# Patient Record
Sex: Female | Born: 1965 | Race: Black or African American | Hispanic: No | Marital: Single | Smoking: Never smoker
Health system: Southern US, Community
[De-identification: ages and names within clinical notes are randomized; demographics above are authoritative.]

## PROBLEM LIST (undated history)

## (undated) DIAGNOSIS — J302 Other seasonal allergic rhinitis: Secondary | ICD-10-CM

## (undated) DIAGNOSIS — G4730 Sleep apnea, unspecified: Secondary | ICD-10-CM

## (undated) DIAGNOSIS — K219 Gastro-esophageal reflux disease without esophagitis: Secondary | ICD-10-CM

## (undated) DIAGNOSIS — R51 Headache: Secondary | ICD-10-CM

## (undated) DIAGNOSIS — M797 Fibromyalgia: Secondary | ICD-10-CM

## (undated) DIAGNOSIS — R569 Unspecified convulsions: Secondary | ICD-10-CM

## (undated) DIAGNOSIS — J069 Acute upper respiratory infection, unspecified: Secondary | ICD-10-CM

## (undated) DIAGNOSIS — T783XXA Angioneurotic edema, initial encounter: Secondary | ICD-10-CM

## (undated) DIAGNOSIS — G459 Transient cerebral ischemic attack, unspecified: Secondary | ICD-10-CM

## (undated) HISTORY — PX: ABDOMINAL HYSTERECTOMY: SHX81

## (undated) HISTORY — PX: OOPHORECTOMY: SHX86

## (undated) HISTORY — PX: APPENDECTOMY: SHX54

## (undated) HISTORY — DX: Acute upper respiratory infection, unspecified: J06.9

## (undated) HISTORY — DX: Angioneurotic edema, initial encounter: T78.3XXA

## (undated) HISTORY — PX: ANKLE ARTHROSCOPY: SHX545

---

## 2006-04-17 ENCOUNTER — Emergency Department (HOSPITAL_COMMUNITY): Admission: EM | Admit: 2006-04-17 | Discharge: 2006-04-17 | Payer: Self-pay | Admitting: Emergency Medicine

## 2006-05-15 ENCOUNTER — Emergency Department (HOSPITAL_COMMUNITY): Admission: EM | Admit: 2006-05-15 | Discharge: 2006-05-15 | Payer: Self-pay | Admitting: Emergency Medicine

## 2006-05-16 ENCOUNTER — Emergency Department (HOSPITAL_COMMUNITY): Admission: EM | Admit: 2006-05-16 | Discharge: 2006-05-17 | Payer: Self-pay | Admitting: Emergency Medicine

## 2006-08-20 ENCOUNTER — Emergency Department (HOSPITAL_COMMUNITY): Admission: EM | Admit: 2006-08-20 | Discharge: 2006-08-20 | Payer: Self-pay | Admitting: Emergency Medicine

## 2006-08-22 ENCOUNTER — Emergency Department (HOSPITAL_COMMUNITY): Admission: EM | Admit: 2006-08-22 | Discharge: 2006-08-22 | Payer: Self-pay | Admitting: Emergency Medicine

## 2006-09-11 ENCOUNTER — Emergency Department (HOSPITAL_COMMUNITY): Admission: EM | Admit: 2006-09-11 | Discharge: 2006-09-11 | Payer: Self-pay | Admitting: Emergency Medicine

## 2006-10-01 ENCOUNTER — Emergency Department (HOSPITAL_COMMUNITY): Admission: EM | Admit: 2006-10-01 | Discharge: 2006-10-01 | Payer: Self-pay | Admitting: Emergency Medicine

## 2006-10-24 ENCOUNTER — Emergency Department (HOSPITAL_COMMUNITY): Admission: EM | Admit: 2006-10-24 | Discharge: 2006-10-24 | Payer: Self-pay | Admitting: Emergency Medicine

## 2007-01-02 ENCOUNTER — Emergency Department (HOSPITAL_COMMUNITY): Admission: EM | Admit: 2007-01-02 | Discharge: 2007-01-02 | Payer: Self-pay | Admitting: *Deleted

## 2007-01-09 ENCOUNTER — Emergency Department (HOSPITAL_COMMUNITY): Admission: EM | Admit: 2007-01-09 | Discharge: 2007-01-09 | Payer: Self-pay | Admitting: Emergency Medicine

## 2007-03-11 ENCOUNTER — Emergency Department (HOSPITAL_COMMUNITY): Admission: EM | Admit: 2007-03-11 | Discharge: 2007-03-11 | Payer: Self-pay | Admitting: Emergency Medicine

## 2007-07-05 ENCOUNTER — Inpatient Hospital Stay (HOSPITAL_COMMUNITY): Admission: EM | Admit: 2007-07-05 | Discharge: 2007-07-10 | Payer: Self-pay | Admitting: Emergency Medicine

## 2007-07-25 ENCOUNTER — Emergency Department (HOSPITAL_COMMUNITY): Admission: EM | Admit: 2007-07-25 | Discharge: 2007-07-25 | Payer: Self-pay | Admitting: Emergency Medicine

## 2007-08-01 ENCOUNTER — Ambulatory Visit: Payer: Self-pay | Admitting: Oncology

## 2007-08-21 LAB — CBC WITH DIFFERENTIAL/PLATELET
BASO%: 0.6 % (ref 0.0–2.0)
EOS%: 3.6 % (ref 0.0–7.0)
LYMPH%: 32.9 % (ref 14.0–48.0)
MCH: 28.7 pg (ref 26.0–34.0)
MCHC: 34 g/dL (ref 32.0–36.0)
MCV: 84.5 fL (ref 81.0–101.0)
MONO%: 5.6 % (ref 0.0–13.0)
NEUT#: 2.5 10*3/uL (ref 1.5–6.5)
Platelets: 264 10*3/uL (ref 145–400)
RBC: 3.84 10*6/uL (ref 3.70–5.32)
RDW: 14.6 % — ABNORMAL HIGH (ref 11.3–14.5)

## 2007-08-21 LAB — CHCC SMEAR

## 2007-08-23 LAB — COMPREHENSIVE METABOLIC PANEL
ALT: <8 U/L (ref 0–35)
Alkaline Phosphatase: 64 U/L (ref 39–117)
Creatinine, Ser: 1.2 mg/dL (ref 0.40–1.20)
Sodium: 145 mEq/L (ref 135–145)
Total Bilirubin: 0.2 mg/dL — ABNORMAL LOW (ref 0.3–1.2)
Total Protein: 7 g/dL (ref 6.0–8.3)

## 2007-08-23 LAB — IMMUNOFIXATION ELECTROPHORESIS
IgA: 249 mg/dL (ref 68–378)
IgM, Serum: 87 mg/dL (ref 60–263)

## 2007-08-23 LAB — SEDIMENTATION RATE: Sed Rate: 24 mm/hr — ABNORMAL HIGH (ref 0–22)

## 2007-08-23 LAB — LACTATE DEHYDROGENASE: LDH: 150 U/L (ref 94–250)

## 2007-09-03 LAB — UIFE/LIGHT CHAINS/TP QN, 24-HR UR
Albumin, U: DETECTED
Free Kappa Lt Chains,Ur: 5.01 mg/dL — ABNORMAL HIGH (ref 0.04–1.51)
Free Kappa/Lambda Ratio: 17.28 ratio — ABNORMAL HIGH (ref 0.46–4.00)
Free Lambda Excretion/Day: 4.06 mg/d
Free Lambda Lt Chains,Ur: 0.29 mg/dL (ref 0.08–1.01)
Time: 24 hours
Total Protein, Urine: 5.6 mg/dL
Volume, Urine: 1400 mL

## 2007-09-03 LAB — CREATININE CLEARANCE, URINE, 24 HOUR
Creatinine, 24H Ur: 1819 mg/d — ABNORMAL HIGH (ref 700–1800)
Creatinine, Urine: 130 mg/dL
Creatinine: 1.06 mg/dL (ref 0.40–1.20)

## 2007-09-21 ENCOUNTER — Ambulatory Visit: Payer: Self-pay | Admitting: Oncology

## 2008-01-18 ENCOUNTER — Ambulatory Visit: Payer: Self-pay | Admitting: Oncology

## 2008-01-22 LAB — CBC WITH DIFFERENTIAL/PLATELET
Basophils Absolute: 0 10*3/uL (ref 0.0–0.1)
EOS%: 1.1 % (ref 0.0–7.0)
Eosinophils Absolute: 0 10*3/uL (ref 0.0–0.5)
HCT: 37.5 % (ref 34.8–46.6)
HGB: 11.9 g/dL (ref 11.6–15.9)
MCH: 27.6 pg (ref 26.0–34.0)
MCV: 87.2 fL (ref 81.0–101.0)
NEUT#: 2.7 10*3/uL (ref 1.5–6.5)
NEUT%: 61.3 % (ref 39.6–76.8)
lymph#: 1.4 10*3/uL (ref 0.9–3.3)

## 2008-01-23 LAB — IGG, IGA, IGM
IgA: 314 mg/dL (ref 68–378)
IgG (Immunoglobin G), Serum: 1450 mg/dL (ref 694–1618)
IgM, Serum: 70 mg/dL (ref 60–263)

## 2008-01-23 LAB — COMPREHENSIVE METABOLIC PANEL
AST: 11 U/L (ref 0–37)
BUN: 16 mg/dL (ref 6–23)
Calcium: 9 mg/dL (ref 8.4–10.5)
Chloride: 113 mEq/L — ABNORMAL HIGH (ref 96–112)
Creatinine, Ser: 1.22 mg/dL — ABNORMAL HIGH (ref 0.40–1.20)
Glucose, Bld: 92 mg/dL (ref 70–99)

## 2008-01-23 LAB — LACTATE DEHYDROGENASE: LDH: 168 U/L (ref 94–250)

## 2008-05-20 ENCOUNTER — Ambulatory Visit: Payer: Self-pay | Admitting: Oncology

## 2008-11-03 IMAGING — CR DG KNEE COMPLETE 4+V*L*
4 series · 4 of 4 positions shown · non-contrast
Comparison: none

09/14/06 - DUPLICATE COPY for exam association in [facility] ? No change from original report.
CLINICAL DATA: Fall. 
 LEFT KNEE - 4 VIEW:

[t knee ap left]
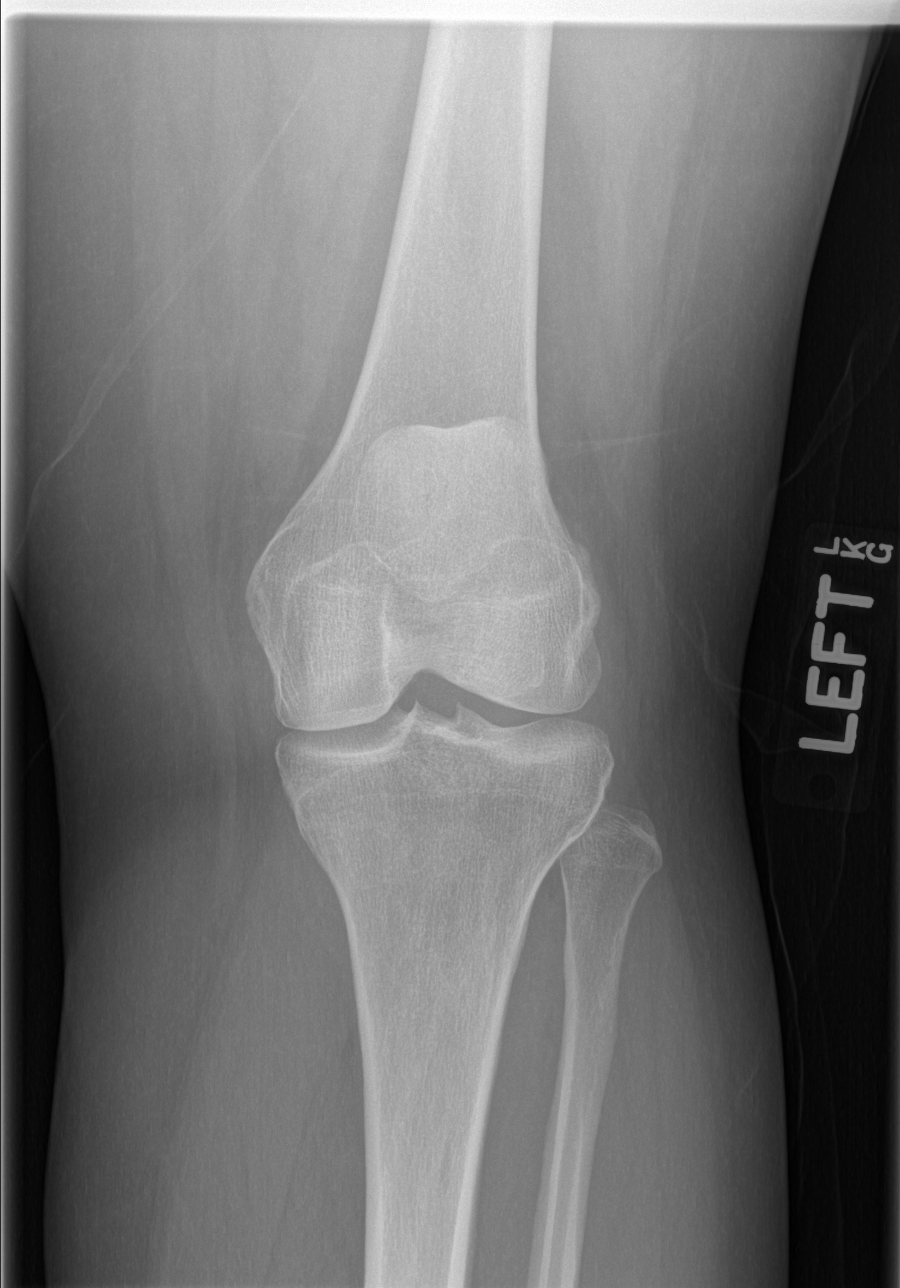

[t knee oblique left (1 of 2)]
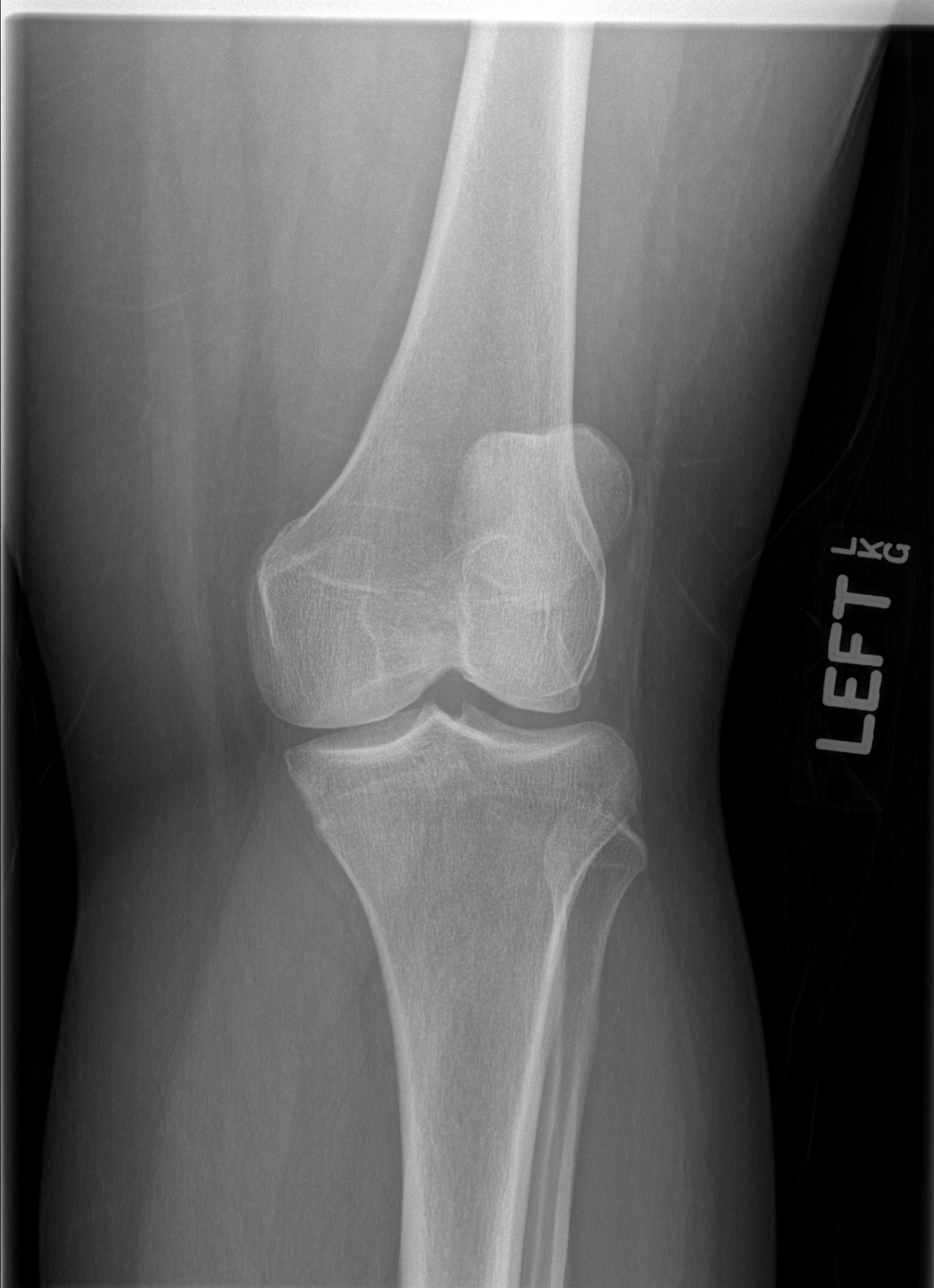

[t knee oblique left (2 of 2)]
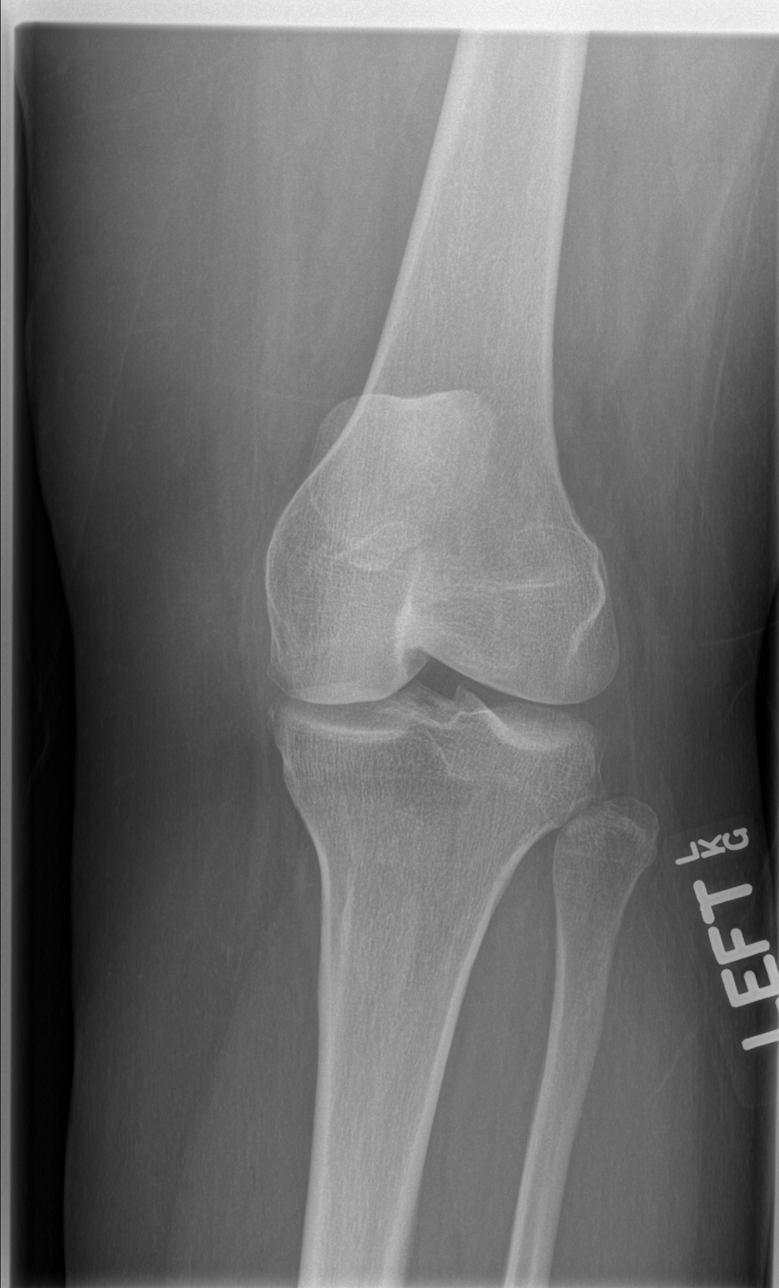

[t knee lat left]
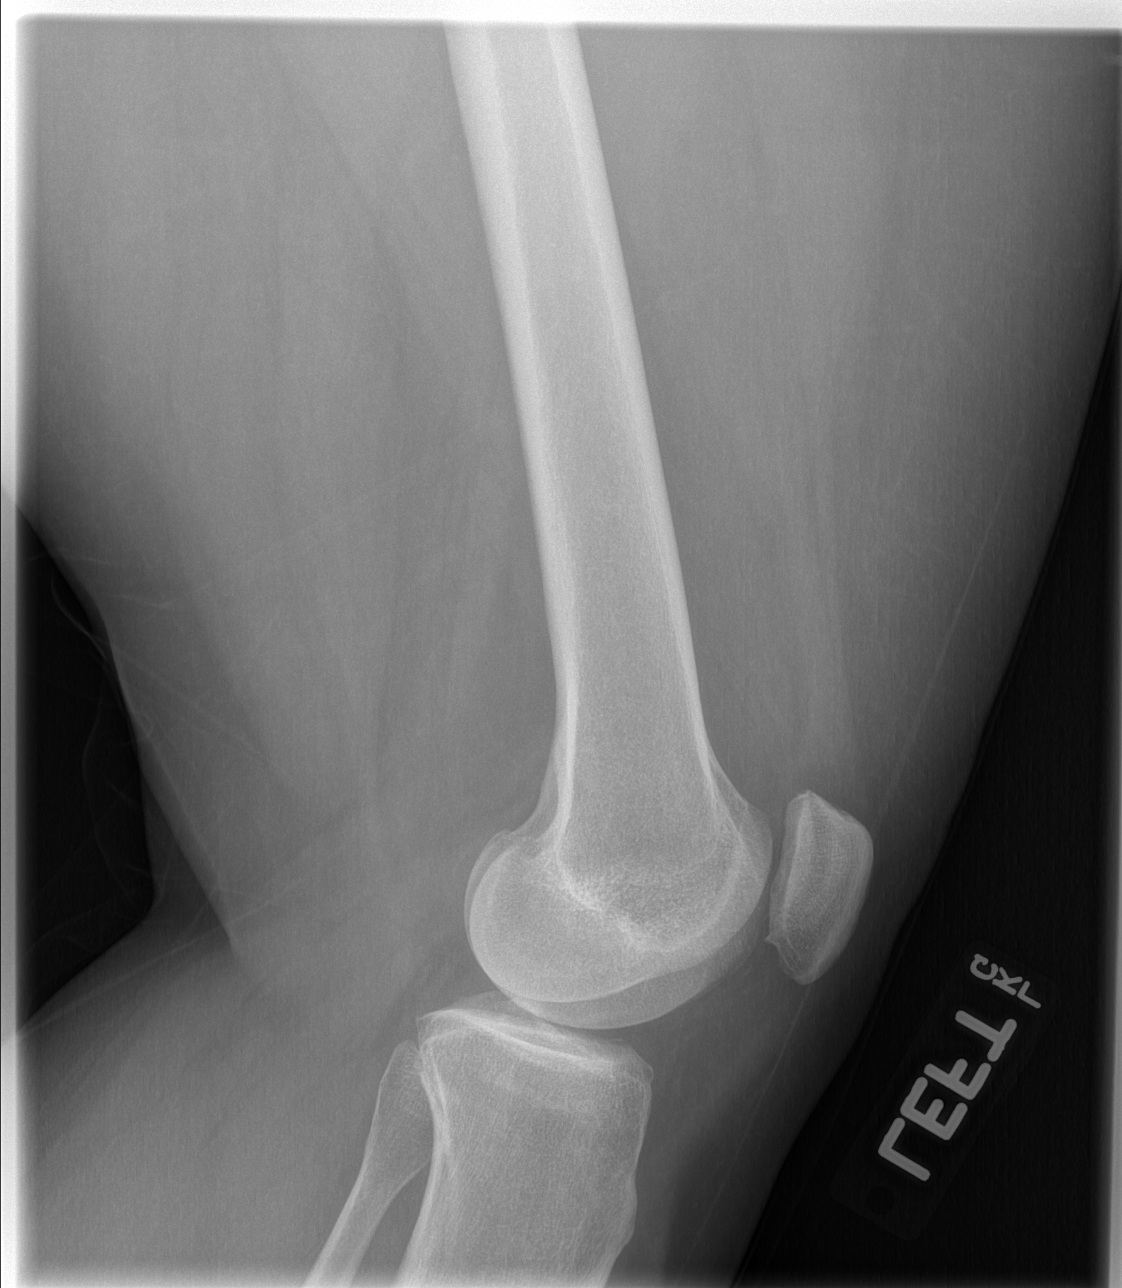

[4 of 4 positions shown; findings below may reference images not displayed]

FINDINGS: Normal alignment and no fracture.  There is minimal spurring of the patella.  Joint spaces are well maintained.
IMPRESSION: No acute abnormality.
 LEFT ANKLE - 3 VIEW:
FINDINGS: Normal alignment and no fracture.
IMPRESSION: Negative.

## 2009-03-03 IMAGING — CR DG ANKLE COMPLETE 3+V*R*
3 series · 3 of 3 positions shown · non-contrast
Comparison: none

CLINICAL DATA: Fall, ankle pain

Exam: Right ankle complete

[t ankle joint ap right]
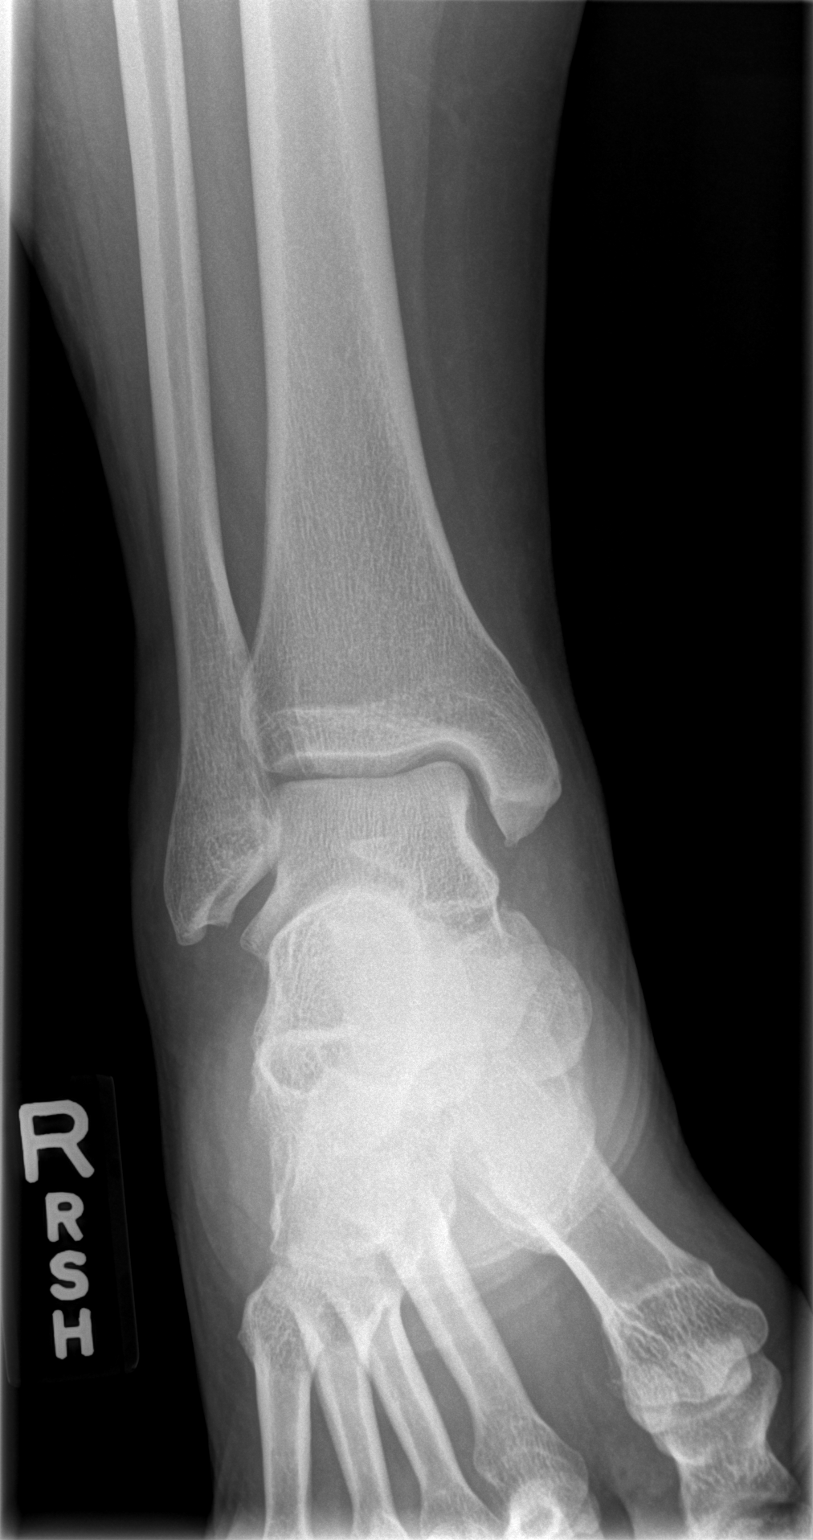

[t ankle joint oblique right]
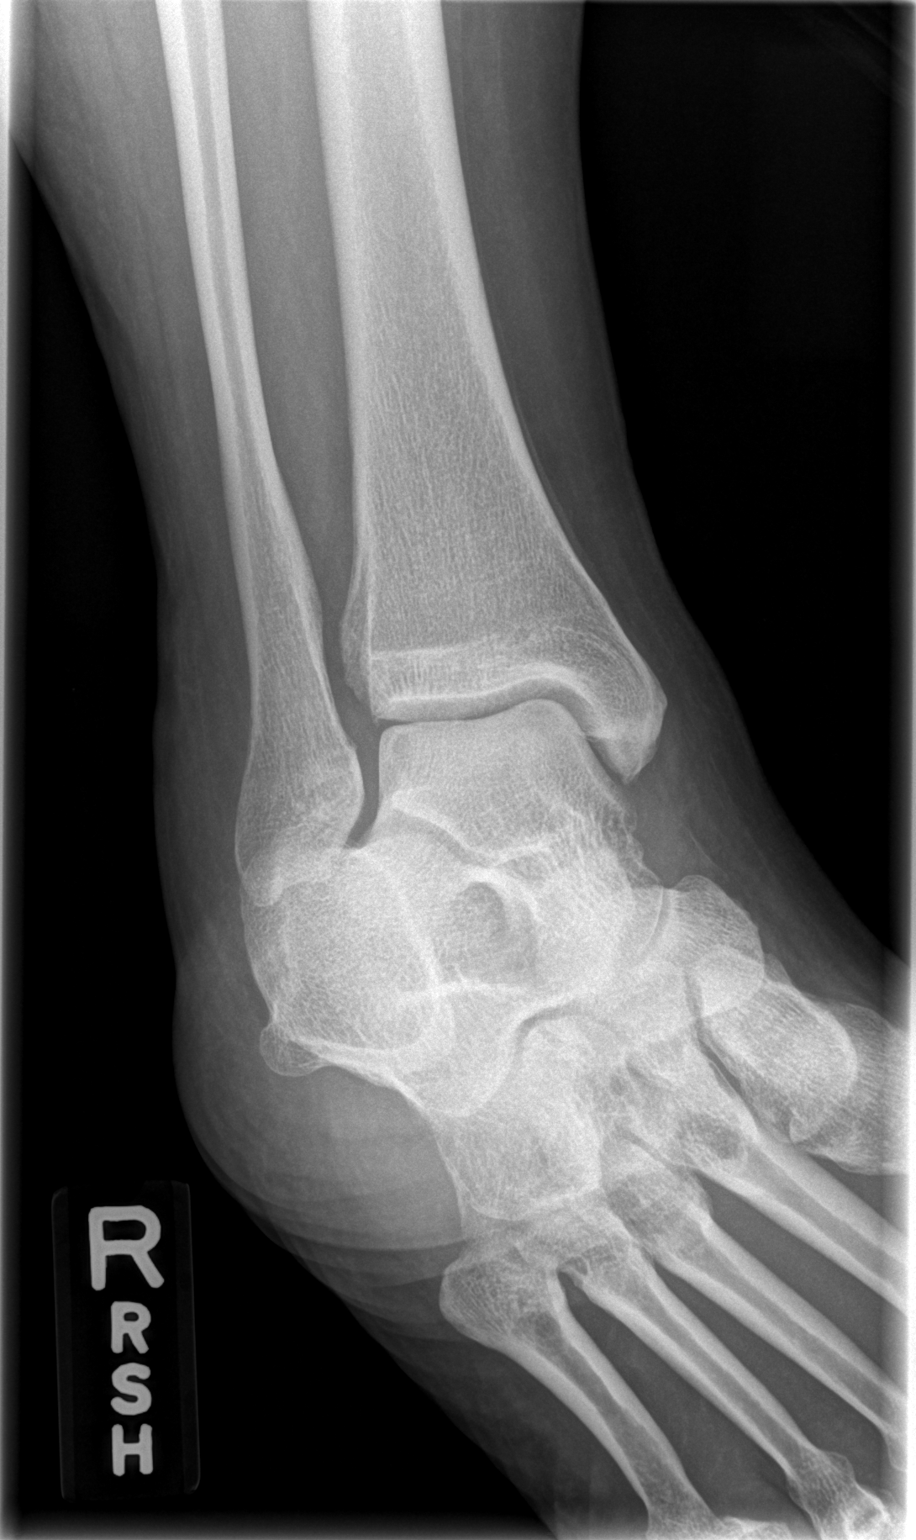

[t ankle joint lat right]
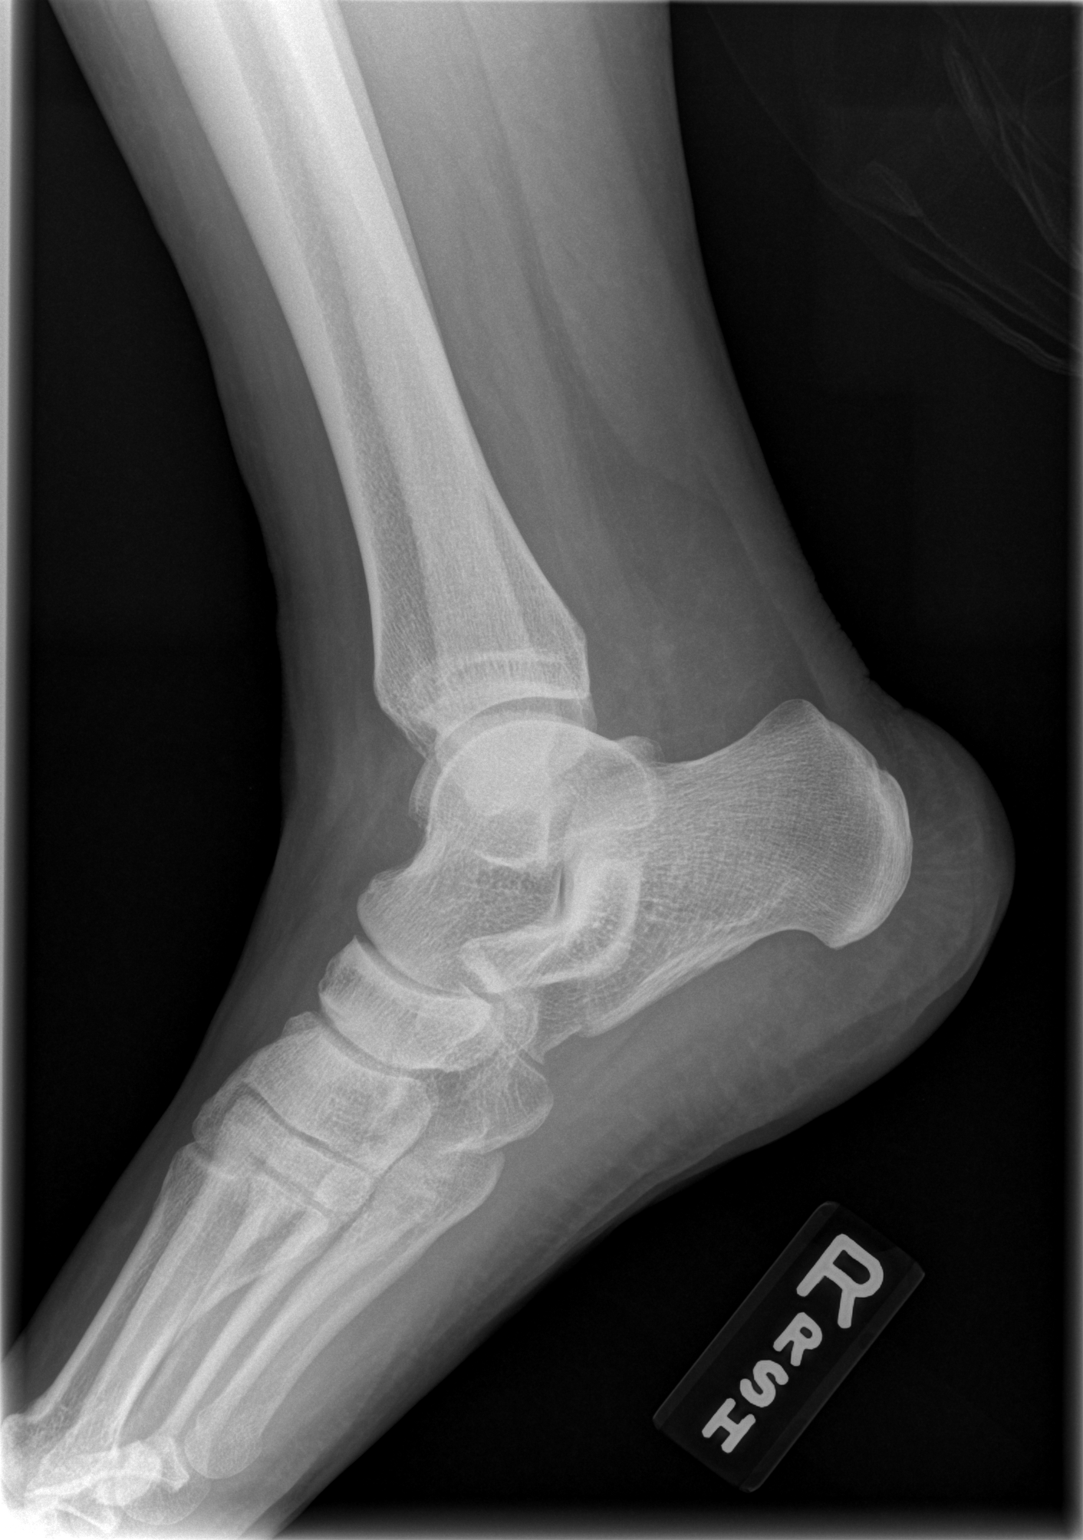

[3 of 3 positions shown; findings below may reference images not displayed]

FINDINGS: 3 views of the right ankle demonstrate a cortical disruption along the
inferior margin of the calcaneal neck seen on lateral projection. The mortise
joint appears intact. Talar dome is continuous.
IMPRESSION: 1. Acute fracture of the neck of the calcaneus.

Finding conveyed to Dr Braz on 01/08/2007 at 9493 .

## 2009-04-10 ENCOUNTER — Emergency Department (HOSPITAL_COMMUNITY): Admission: EM | Admit: 2009-04-10 | Discharge: 2009-04-11 | Payer: Self-pay | Admitting: Emergency Medicine

## 2009-10-19 ENCOUNTER — Ambulatory Visit: Payer: Self-pay | Admitting: Internal Medicine

## 2009-10-19 ENCOUNTER — Inpatient Hospital Stay (HOSPITAL_COMMUNITY)
Admission: EM | Admit: 2009-10-19 | Discharge: 2009-10-26 | Payer: Self-pay | Source: Home / Self Care | Admitting: Emergency Medicine

## 2009-10-20 ENCOUNTER — Encounter (INDEPENDENT_AMBULATORY_CARE_PROVIDER_SITE_OTHER): Payer: Self-pay | Admitting: Internal Medicine

## 2009-10-21 ENCOUNTER — Ambulatory Visit: Payer: Self-pay | Admitting: Psychiatry

## 2009-12-22 ENCOUNTER — Emergency Department (HOSPITAL_COMMUNITY): Admission: EM | Admit: 2009-12-22 | Discharge: 2009-12-23 | Payer: Self-pay | Admitting: Emergency Medicine

## 2010-01-21 ENCOUNTER — Emergency Department (HOSPITAL_COMMUNITY): Admission: EM | Admit: 2010-01-21 | Discharge: 2010-01-21 | Payer: Self-pay | Admitting: Emergency Medicine

## 2010-07-15 LAB — DIFFERENTIAL
Basophils Absolute: 0 10*3/uL (ref 0.0–0.1)
Lymphocytes Relative: 28 % (ref 12–46)
Monocytes Absolute: 0.3 10*3/uL (ref 0.1–1.0)
Monocytes Relative: 6 % (ref 3–12)
Neutro Abs: 2.7 10*3/uL (ref 1.7–7.7)
Neutrophils Relative %: 61 % (ref 43–77)

## 2010-07-15 LAB — BASIC METABOLIC PANEL
CO2: 19 mEq/L (ref 19–32)
GFR calc Af Amer: >60 mL/min (ref >60)
Glucose, Bld: 141 mg/dL — ABNORMAL HIGH (ref 70–99)
Potassium: 3.3 mEq/L — ABNORMAL LOW (ref 3.5–5.1)

## 2010-07-15 LAB — RPR: RPR Ser Ql: NONREACTIVE

## 2010-07-15 LAB — CBC
HCT: 31.1 % — ABNORMAL LOW (ref 36.0–46.0)
MCH: 28.2 pg (ref 26.0–34.0)
MCHC: 33.3 g/dL (ref 30.0–36.0)
MCV: 84.7 fL (ref 78.0–100.0)
RDW: 14.1 % (ref 11.5–15.5)
WBC: 4.4 10*3/uL (ref 4.0–10.5)

## 2010-07-15 LAB — WET PREP, GENITAL
Clue Cells Wet Prep HPF POC: NONE SEEN
Trich, Wet Prep: NONE SEEN
Yeast Wet Prep HPF POC: NONE SEEN

## 2010-07-15 LAB — GC/CHLAMYDIA PROBE AMP, GENITAL: Chlamydia, DNA Probe: NEGATIVE

## 2010-07-16 LAB — COMPREHENSIVE METABOLIC PANEL
AST: 15 U/L (ref 0–37)
Albumin: 4.1 g/dL (ref 3.5–5.2)
BUN: 22 mg/dL (ref 6–23)
Calcium: 9.1 mg/dL (ref 8.4–10.5)
Creatinine, Ser: 1.12 mg/dL (ref 0.4–1.2)
GFR calc Af Amer: >60 mL/min (ref >60)
Total Bilirubin: 0.4 mg/dL (ref 0.3–1.2)

## 2010-07-16 LAB — URINALYSIS, ROUTINE W REFLEX MICROSCOPIC
Bilirubin Urine: NEGATIVE
Hgb urine dipstick: NEGATIVE
Ketones, ur: NEGATIVE mg/dL
Nitrite: NEGATIVE
Protein, ur: NEGATIVE mg/dL
Specific Gravity, Urine: 1.037 — ABNORMAL HIGH (ref 1.005–1.030)
Urobilinogen, UA: 1 mg/dL (ref 0.0–1.0)

## 2010-07-16 LAB — CBC
MCH: 28.4 pg (ref 26.0–34.0)
MCHC: 32.6 g/dL (ref 30.0–36.0)
MCV: 86.9 fL (ref 78.0–100.0)
Platelets: 236 10*3/uL (ref 150–400)
RDW: 14.4 % (ref 11.5–15.5)

## 2010-07-16 LAB — DIFFERENTIAL
Basophils Absolute: 0 10*3/uL (ref 0.0–0.1)
Eosinophils Relative: 3 % (ref 0–5)
Lymphocytes Relative: 23 % (ref 12–46)
Lymphs Abs: 1.3 10*3/uL (ref 0.7–4.0)
Monocytes Absolute: 0.3 10*3/uL (ref 0.1–1.0)
Neutro Abs: 4 10*3/uL (ref 1.7–7.7)

## 2010-07-16 LAB — LIPASE, BLOOD: Lipase: 42 U/L (ref 11–59)

## 2010-07-18 LAB — COMPREHENSIVE METABOLIC PANEL
ALT: 11 U/L (ref 0–35)
AST: 16 U/L (ref 0–37)
Alkaline Phosphatase: 52 U/L (ref 39–117)
Alkaline Phosphatase: 71 U/L (ref 39–117)
BUN: 17 mg/dL (ref 6–23)
CO2: 20 mEq/L (ref 19–32)
CO2: 21 mEq/L (ref 19–32)
CO2: 21 mEq/L (ref 19–32)
Calcium: 8.9 mg/dL (ref 8.4–10.5)
Calcium: 9.2 mg/dL (ref 8.4–10.5)
Chloride: 114 mEq/L — ABNORMAL HIGH (ref 96–112)
Chloride: 118 mEq/L — ABNORMAL HIGH (ref 96–112)
Creatinine, Ser: 1.22 mg/dL — ABNORMAL HIGH (ref 0.4–1.2)
GFR calc Af Amer: >60 mL/min (ref >60)
GFR calc non Af Amer: 47 mL/min — ABNORMAL LOW (ref >60)
GFR calc non Af Amer: 58 mL/min — ABNORMAL LOW (ref >60)
GFR calc non Af Amer: 60 mL/min — ABNORMAL LOW (ref >60)
Glucose, Bld: 102 mg/dL — ABNORMAL HIGH (ref 70–99)
Glucose, Bld: 104 mg/dL — ABNORMAL HIGH (ref 70–99)
Glucose, Bld: 110 mg/dL — ABNORMAL HIGH (ref 70–99)
Potassium: 3.8 mEq/L (ref 3.5–5.1)
Potassium: 4.1 mEq/L (ref 3.5–5.1)
Sodium: 140 mEq/L (ref 135–145)
Total Bilirubin: 0.3 mg/dL (ref 0.3–1.2)
Total Bilirubin: 0.3 mg/dL (ref 0.3–1.2)

## 2010-07-18 LAB — CBC
HCT: 30.6 % — ABNORMAL LOW (ref 36.0–46.0)
Hemoglobin: 12.3 g/dL (ref 12.0–15.0)
Hemoglobin: 9.9 g/dL — ABNORMAL LOW (ref 12.0–15.0)
MCHC: 32.4 g/dL (ref 30.0–36.0)
MCHC: 32.9 g/dL (ref 30.0–36.0)
MCV: 88 fL (ref 78.0–100.0)
RBC: 3.47 MIL/uL — ABNORMAL LOW (ref 3.87–5.11)
RBC: 3.74 MIL/uL — ABNORMAL LOW (ref 3.87–5.11)
RBC: 3.89 MIL/uL (ref 3.87–5.11)
RBC: 4.28 MIL/uL (ref 3.87–5.11)
RDW: 14.1 % (ref 11.5–15.5)
RDW: 14.4 % (ref 11.5–15.5)
WBC: 4.2 10*3/uL (ref 4.0–10.5)
WBC: 4.3 10*3/uL (ref 4.0–10.5)
WBC: 4.4 10*3/uL (ref 4.0–10.5)

## 2010-07-18 LAB — URINE CULTURE: Colony Count: 40000

## 2010-07-18 LAB — URINALYSIS, ROUTINE W REFLEX MICROSCOPIC
Bilirubin Urine: NEGATIVE
Hgb urine dipstick: NEGATIVE
Nitrite: NEGATIVE
Specific Gravity, Urine: 1.028 (ref 1.005–1.030)
Urobilinogen, UA: 1 mg/dL (ref 0.0–1.0)
pH: 7 (ref 5.0–8.0)

## 2010-07-18 LAB — CARDIAC PANEL(CRET KIN+CKTOT+MB+TROPI)
CK, MB: 0.6 ng/mL (ref 0.3–4.0)
Relative Index: INVALID (ref 0.0–2.5)
Total CK: 67 U/L (ref 7–177)
Total CK: 69 U/L (ref 7–177)
Troponin I: 0.01 ng/mL (ref 0.00–0.06)
Troponin I: 0.02 ng/mL (ref 0.00–0.06)
Troponin I: <0.01 ng/mL (ref 0.00–0.06)

## 2010-07-18 LAB — FERRITIN: Ferritin: 56 ng/mL (ref 10–291)

## 2010-07-18 LAB — CULTURE, BLOOD (SINGLE)
Culture: NO GROWTH
Culture: NO GROWTH

## 2010-07-18 LAB — LIPASE, BLOOD: Lipase: 38 U/L (ref 11–59)

## 2010-07-18 LAB — DIFFERENTIAL
Basophils Relative: 1 % (ref 0–1)
Eosinophils Absolute: 0.1 10*3/uL (ref 0.0–0.7)
Eosinophils Relative: 3 % (ref 0–5)
Lymphs Abs: 1.2 10*3/uL (ref 0.7–4.0)

## 2010-07-18 LAB — IRON: Iron: 49 ug/dL (ref 42–135)

## 2010-07-18 LAB — POCT CARDIAC MARKERS
CKMB, poc: <1 ng/mL — ABNORMAL LOW (ref 1.0–8.0)
Troponin i, poc: <0.05 ng/mL (ref 0.00–0.09)
Troponin i, poc: <0.05 ng/mL (ref 0.00–0.09)

## 2010-07-18 LAB — RAPID URINE DRUG SCREEN, HOSP PERFORMED
Barbiturates: NOT DETECTED
Cocaine: NOT DETECTED
Opiates: NOT DETECTED

## 2010-09-14 NOTE — Discharge Summary (Signed)
NAMEARGIE, LOBER                  ACCOUNT NO.:  [id number]   MEDICAL RECORD NO.:  [id number]          PATIENT TYPE:  INP   LOCATION:  1424                         FACILITY:  [facility]   PHYSICIAN:  Corinna L. Lendell Caprice, MDDATE OF BIRTH:  1965-12-27   DATE OF ADMISSION:  07/05/2007  DATE OF DISCHARGE:  07/10/2007                               DISCHARGE SUMMARY   DISCHARGE DIAGNOSES:  1. Aseptic meningitis.  2. History of migraines.  3. Gastroesophageal reflux disease.  4. Irritable bowel syndrome  5. Fibromyalgia.  6. Osteoporosis.  7. History of transient ischemic attack.  8. History of petit mal seizures.   DISCHARGE MEDICATIONS:  1. Phenergan 25 mg p.o. q.6 h. p.r.n. nausea.  2. Percocet 5/325 1-2 p.o. q.4 h. p.r.n. pain, dispense 30.  3. She may continue the rest of her outpatient medications.   Please see H&P for details.   FOLLOWUP:  With Dr. Duane Lope as needed.  Condition stable.   CONSULTATIONS:  None.   PROCEDURES:  Lumbar puncture.   DIET:  Should be as tolerated.   ACTIVITY:  Ad lib.   LABORATORY:  CBC significant for a hemoglobin 11.2, hematocrit 33.9, MCV  84.  Basic metabolic panel unremarkable.  LFTs significant for an  albumin of 3.0.  CSF tube number 2 showed 61 white cells, 5 red cells  with 48% neutrophils, 39% lymphocytes.  CSF protein is 139, CSF glucose  63, CSF culture negative, RPR nonreactive, urine culture negative, blood  cultures negative, UA negative.   SPECIAL STUDIES/RADIOLOGY:  The CT brain without contrast was normal,  nothing acute.   HISTORY AND HOSPITAL COURSE:  Ms. Cunningham is a 45 year old black female with  history of migraines, who presented with several-hour history of  excruciating periorbital pain and neck pain.  She awoke with a headache.  She had some photophobia, neck stiffness and chills.  The pain was  severe enough that she cried.  She arrived via EMS and vomited on route.  She has a history of migraines and had been having  headaches daily for 3  weeks, but this one was much more severe, and the character of the  headache was different.  She has a history of viral meningitis.  She had  no mouth or genital ulcers.  She has had one sexual partner in her life.  She had normal vital signs.  She had meningismus and Kernig's sign.  Lumbar puncture was done under fluoroscopy, and the patient was admitted  for further workup and treatment.  She was started initially empirically  on Rocephin and vancomycin.  However, the clinical presentation was more  consistent with viral meningitis.  Nevertheless, she was put on droplet  precautions.  She continued to have quite a bit of pain and nausea, but  at the time of discharge was tolerating a regular diet and oral pain and  nausea medications.  Her CSF culture is negative, and her antibiotics  have been stopped.  She did develop some diarrhea which apparently is a  chronic problem for her, as she has irritable bowel syndrome but  also  consider secondary to antibiotics.  She is tolerating 100% of her diet.  Condition stable.      Corinna L. Lendell Caprice, MD  Electronically Signed     CLS/MEDQ  D:  07/10/2007  T:  07/11/2007  Job:  [id number]   cc:   C. Duane Lope, M.D.  Fax: [phone]

## 2010-09-14 NOTE — H&P (Signed)
Margaret, Gay                  ACCOUNT NO.:  [id number]   MEDICAL RECORD NO.:  [id number]          PATIENT TYPE:  INP   LOCATION:  1424                         [facility]ACILITY:  [facility]   PHYSICIAN:  Ramiro Harvest, MD    DATE OF BIRTH:  [birth year]   DATE OF ADMISSION:  07/05/2007  DATE OF DISCHARGE:                              HISTORY & PHYSICAL   The patient's primary care physician is Dr. Tenny Craw. The patient also has a  neurologist who is Dr. Beverely Pace. The patient states also has another PCP  in [facility]. The patient also stays close to the [place].   HISTORY OF PRESENT ILLNESS:  Margaret Gay is a 45 year old, African-  American female with a history of prior viral meningitis, migraine  headaches, irritable bowel syndrome, and history of TIA who presents to  the ED with a several hour history of excruciating periorbital headache  radiating to the neck and down her back which was at greater than 10.  The patient states she awoke with this headache on the morning of July 05, 2007 which was very excruciating with some associated photophobia,  neck stiffness and chills which made her cry. On the way to the ED, the  patient had an episode of emesis in the EMS truck.  The patient states  that she has also been having headaches daily for the past 3 weeks but  the headache that prompted her to come to the ED was very different. The  patient denies any fever, no chest pain or palpitations.  No mental  status changes, no visual changes, some occasional shortness of breath,  no exertion, no recent travel, no immobility,  no other associated  symptoms. In the LP, a lumbar puncture was done with results which  showed a WBC of 105 with a  cell diff count with segmented neutrophils  of 48, a glucose level of 63 and a protein of 139.  The patient was  given a dose of Rocephin in the ED and we were called to admit the  patient for further evaluation and management.   ALLERGIES:  COMPAZINE leads to an  adverse reaction per patient. Also  allergic to ERYTHROMYCIN which causes itches. She is also allergic to  PENICILLIN and that causes swelling in the hands and VOLTAREN causes GI  upset.   PAST MEDICAL HISTORY:  1. Migraine headaches.  2. Asthma.  3. Gastroesophageal reflux disease.  4. Irritable bowel syndrome.  5. Arthritis.  6. Fibromyalgia.  7. Osteoporosis.  8. History of TIA.  9. Status post left oophorectomy.  10.Status post ankle surgery.  11.History of viral meningitis in 1995.  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   HOME MEDICATIONS:  1. Topiramate 200 mg p.o. b.i.d.  2. Protonix 40 mg daily.  3. Ranitidine 300 mg daily.  4. Aspirin 81 mg daily.  5. Zomig 5 mg as needed.  6. Zyrtec 10 mg daily.  7. Proventil 2 puffs q.4 h p.r.n.  8. Robaxin 750 mg t.i.d.  9. Estradiol 1 mg daily.   SURGICAL HISTORY:  Status post appendectomy, status post hysterectomy,  status post oophorectomy..   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  No tobacco abuse.  No alcohol use.  No  IV drug use.  No children.   FAMILY HISTORY:  Mother alive age 39 and healthy.  Father alive age 61  with heart failure and hypertension.  The patient has two brothers all  of whom are healthy.   REVIEW OF SYSTEMS:  As per HPI and also positive for headaches daily x3  weeks and occasional shortness of breath on exertion, otherwise  negative.   PHYSICAL EXAMINATION:  Temperature 98.3, blood pressure 103/68, pulse of  98, respiratory rate 18, satting 99% on room air.  GENERAL:  Patient is sleeping in a dark room.  HEENT:  Normocephalic, atraumatic. Pupils equal, round and reactive to  light.  Extraocular movements intact.  Oropharynx is dry. No lesions, no  exudate.  Positive neck stiffness, positive Kernig's sign.  RESPIRATORY:  Lungs are  clear to auscultation bilaterally.  No wheezes,  no rhonchi.  CARDIOVASCULAR:  Regular rate and rhythm.  No murmurs, rubs or gallops.  ABDOMEN:  Soft, obese, nontender, nondistended.  Positive bowel sounds.  EXTREMITIES:  No clubbing, cyanosis or edema.  NEUROLOGIC:  The patient is alert and oriented x3.  Cranial nerves II-  XII are grossly intact.  No focal deficits.   LABORATORY DATA:  Sodium 141, potassium 4.4, chloride 115, bicarb 21,  BUN 12, creatinine 1.05, glucose 105, calcium of 8.7.  CBC, white count  7.1, hemoglobin 11.2, platelets 226, hematocrit 33.9, ANC of 5.9.  CT of  the head was stable.  No acute findings.  Normal noncontrasted brain. LP  opening pressure of 23 cm, CSF glucose 63, CSF protein 139. Cell count  and diff was colorless, clear, WBC of 105, RBC of 7,  segmented  neutrophils 48, lymphs 50,  monocytes 2, eosinophil 0.   ASSESSMENT AND PLAN:  Margaret Gay is a 45 year old, African-American  female with a history of prior viral meningitis, history of migraine  headaches, history of a prior TIA who presented to the ED with  excruciating headache, photophobia, neck stiffness, emesis and chills.  1. Meningitis. LP with a cell count and diff with an elevated white      blood cell count and neutrophils however, glucose is normal and      protein elevated. We will check blood cultures x2.  Check a RPR,      check a UA, check a CMET. The patient already received a dose of      antibiotics in the ED and as such, dexamethasone cannot be started.      While we await CSF culture results, will start empiric antibiotics      with vancomycin and Rocephin, IV fluid hydration and pain      management. We will need to consult with infectious disease in the      morning.  2. Migraine headaches.  Continue home dose topiramate, hold Zomig for      now.  3. Asthma, stable. Albuterol MDI p.r.n.  4. Gastroesophageal reflux disease. Protonix.  5. Irritable bowel syndrome.  6.  Allergies, Zyrtec 10 mg daily.  7. Fibromyalgia.  8. Osteoporosis.  9. History of TIA.  10.History of petite mild seizures secondary to prior viral  meningitis.  11.Prophylaxis. Protonix for GI prophylaxis.  SCDs for DVT      prophylaxis.   It was a pleasure taking care of Ms. Mccubbins.      Ramiro Harvest, MD  Electronically Signed     DT/MEDQ  D:  07/06/2007  T:  07/06/2007  Job:  [id number]

## 2011-01-24 LAB — CULTURE, BLOOD (ROUTINE X 2)

## 2011-01-24 LAB — URINALYSIS, ROUTINE W REFLEX MICROSCOPIC
Bilirubin Urine: NEGATIVE
Glucose, UA: NEGATIVE
Hgb urine dipstick: NEGATIVE
Ketones, ur: NEGATIVE
Protein, ur: NEGATIVE

## 2011-01-24 LAB — COMPREHENSIVE METABOLIC PANEL
ALT: 9
AST: 12
Albumin: 3 — ABNORMAL LOW
Alkaline Phosphatase: 55
BUN: 10
Chloride: 115 — ABNORMAL HIGH
Potassium: 4
Sodium: 140
Total Bilirubin: 0.6

## 2011-01-24 LAB — BASIC METABOLIC PANEL
BUN: 12
CO2: 21
Calcium: 8.7
Chloride: 115 — ABNORMAL HIGH
Creatinine, Ser: 1.05
GFR calc Af Amer: >60
GFR calc non Af Amer: 57 — ABNORMAL LOW
Glucose, Bld: 105 — ABNORMAL HIGH
Potassium: 4.4
Sodium: 141

## 2011-01-24 LAB — CSF CELL COUNT WITH DIFFERENTIAL
Eosinophils, CSF: 0
Eosinophils, CSF: 1
Lymphs, CSF: 39 — ABNORMAL LOW
Lymphs, CSF: 50
Monocyte-Macrophage-Spinal Fluid: 2 — ABNORMAL LOW
Monocyte-Macrophage-Spinal Fluid: 3 — ABNORMAL LOW
RBC Count, CSF: 5 — ABNORMAL HIGH
RBC Count, CSF: 7 — ABNORMAL HIGH
Segmented Neutrophils-CSF: 48 — ABNORMAL HIGH
Segmented Neutrophils-CSF: 57 — ABNORMAL HIGH
Tube #: 2
Tube #: 4
WBC, CSF: 105 — ABNORMAL HIGH
WBC, CSF: 61 — ABNORMAL HIGH

## 2011-01-24 LAB — CBC
HCT: 33.8 — ABNORMAL LOW
Platelets: 190
RBC: 3.99
WBC: 6.7
WBC: 7.1

## 2011-01-24 LAB — CSF CULTURE W GRAM STAIN
Culture: NO GROWTH
Gram Stain: NONE SEEN

## 2011-01-24 LAB — URINE CULTURE: Colony Count: NO GROWTH

## 2011-01-24 LAB — PROTEIN, CSF: Total  Protein, CSF: 139 — ABNORMAL HIGH

## 2011-01-24 LAB — DIFFERENTIAL
Basophils Relative: 0
Lymphs Abs: 1
Monocytes Relative: 2 — ABNORMAL LOW
Neutro Abs: 5.9
Neutrophils Relative %: 83 — ABNORMAL HIGH

## 2011-01-24 LAB — GLUCOSE, CSF: Glucose, CSF: 63

## 2011-02-08 LAB — URINALYSIS, ROUTINE W REFLEX MICROSCOPIC
Bilirubin Urine: NEGATIVE
Glucose, UA: NEGATIVE
Hgb urine dipstick: NEGATIVE
Ketones, ur: 15 — AB
Nitrite: NEGATIVE
Protein, ur: NEGATIVE
Specific Gravity, Urine: 1.042 — ABNORMAL HIGH
Urobilinogen, UA: 1
pH: 6

## 2011-02-08 LAB — POCT I-STAT CREATININE
Creatinine, Ser: 1
Operator id: 151321

## 2011-02-08 LAB — I-STAT 8, (EC8 V) (CONVERTED LAB)
Acid-base deficit: 8 — ABNORMAL HIGH
BUN: 21
Bicarbonate: 17.3 — ABNORMAL LOW
Chloride: 116 — ABNORMAL HIGH
HCT: 39
Hemoglobin: 13.3
Operator id: 151321
Sodium: 144

## 2011-02-08 LAB — POCT PREGNANCY, URINE
Operator id: 151321
Preg Test, Ur: NEGATIVE

## 2011-09-05 ENCOUNTER — Encounter (HOSPITAL_BASED_OUTPATIENT_CLINIC_OR_DEPARTMENT_OTHER): Payer: Self-pay | Admitting: *Deleted

## 2011-09-05 NOTE — Progress Notes (Signed)
To bring all meds,cpap-and overnight bag in case she needs to stay-lives alone No labs needed This is 3rd surg on ankle

## 2011-09-07 ENCOUNTER — Encounter (HOSPITAL_BASED_OUTPATIENT_CLINIC_OR_DEPARTMENT_OTHER): Payer: Self-pay | Admitting: *Deleted

## 2011-09-07 ENCOUNTER — Ambulatory Visit (HOSPITAL_BASED_OUTPATIENT_CLINIC_OR_DEPARTMENT_OTHER)
Admission: RE | Admit: 2011-09-07 | Discharge: 2011-09-07 | Disposition: A | Discharge status: Home / Self Care | Payer: Medicare Other | Source: Ambulatory Visit | Attending: Orthopedic Surgery | Admitting: Orthopedic Surgery

## 2011-09-07 ENCOUNTER — Encounter (HOSPITAL_BASED_OUTPATIENT_CLINIC_OR_DEPARTMENT_OTHER)
Admission: RE | Disposition: A | Discharge status: Home / Self Care | Payer: Self-pay | Source: Ambulatory Visit | Attending: Orthopedic Surgery

## 2011-09-07 ENCOUNTER — Ambulatory Visit (HOSPITAL_BASED_OUTPATIENT_CLINIC_OR_DEPARTMENT_OTHER): Payer: Medicare Other | Admitting: *Deleted

## 2011-09-07 DIAGNOSIS — J45909 Unspecified asthma, uncomplicated: Secondary | ICD-10-CM | POA: Insufficient documentation

## 2011-09-07 DIAGNOSIS — K219 Gastro-esophageal reflux disease without esophagitis: Secondary | ICD-10-CM | POA: Insufficient documentation

## 2011-09-07 DIAGNOSIS — Z87828 Personal history of other (healed) physical injury and trauma: Secondary | ICD-10-CM | POA: Insufficient documentation

## 2011-09-07 DIAGNOSIS — M25879 Other specified joint disorders, unspecified ankle and foot: Secondary | ICD-10-CM | POA: Insufficient documentation

## 2011-09-07 DIAGNOSIS — M25579 Pain in unspecified ankle and joints of unspecified foot: Secondary | ICD-10-CM

## 2011-09-07 HISTORY — PX: ANKLE ARTHROSCOPY: SHX545

## 2011-09-07 HISTORY — DX: Other seasonal allergic rhinitis: J30.2

## 2011-09-07 HISTORY — DX: Sleep apnea, unspecified: G47.30

## 2011-09-07 HISTORY — DX: Transient cerebral ischemic attack, unspecified: G45.9

## 2011-09-07 HISTORY — DX: Fibromyalgia: M79.7

## 2011-09-07 HISTORY — DX: Headache: R51

## 2011-09-07 HISTORY — DX: Unspecified convulsions: R56.9

## 2011-09-07 HISTORY — DX: Gastro-esophageal reflux disease without esophagitis: K21.9

## 2011-09-07 SURGERY — ARTHROSCOPY, ANKLE
Anesthesia: General | Site: Ankle | Laterality: Right | Wound class: Clean

## 2011-09-07 MED ORDER — SODIUM CHLORIDE 0.9 % IV SOLN: INTRAVENOUS | Status: DC

## 2011-09-07 MED ORDER — FENTANYL CITRATE 0.05 MG/ML IJ SOLN
100.0000 ug | INTRAMUSCULAR | Status: DC | PRN
  Administered 2011-09-07: 100 ug via INTRAVENOUS

## 2011-09-07 MED ORDER — HYDROMORPHONE HCL PF 1 MG/ML IJ SOLN
0.2500 mg | INTRAMUSCULAR | Status: DC | PRN
  Administered 2011-09-07 (×3): 0.5 mg via INTRAVENOUS

## 2011-09-07 MED ORDER — VITAMIN C 500 MG PO TABS: 500.0000 mg | ORAL_TABLET | Freq: Every day | ORAL | Status: AC

## 2011-09-07 MED ORDER — MIDAZOLAM HCL 2 MG/2ML IJ SOLN
2.0000 mg | INTRAMUSCULAR | Status: DC | PRN
  Administered 2011-09-07: 2 mg via INTRAVENOUS

## 2011-09-07 MED ORDER — DEXAMETHASONE SODIUM PHOSPHATE 4 MG/ML IJ SOLN
INTRAMUSCULAR | Status: DC | PRN
  Administered 2011-09-07: 4 mg via INTRAVENOUS

## 2011-09-07 MED ORDER — FENTANYL CITRATE 0.05 MG/ML IJ SOLN
INTRAMUSCULAR | Status: DC | PRN
  Administered 2011-09-07 (×2): 25 ug via INTRAVENOUS

## 2011-09-07 MED ORDER — ROPIVACAINE HCL 5 MG/ML IJ SOLN
INTRAMUSCULAR | Status: DC | PRN
  Administered 2011-09-07: 40 mL via EPIDURAL

## 2011-09-07 MED ORDER — HYDROCODONE-ACETAMINOPHEN 5-325 MG PO TABS: 1.0000 | ORAL_TABLET | Freq: Four times a day (QID) | ORAL | Status: AC | PRN

## 2011-09-07 MED ORDER — PROPOFOL 10 MG/ML IV EMUL
INTRAVENOUS | Status: DC | PRN
  Administered 2011-09-07: 20 mg via INTRAVENOUS
  Administered 2011-09-07: 150 mg via INTRAVENOUS

## 2011-09-07 MED ORDER — ACETAMINOPHEN 10 MG/ML IV SOLN
1000.0000 mg | Freq: Once | INTRAVENOUS | Status: AC
  Administered 2011-09-07: 1000 mg via INTRAVENOUS

## 2011-09-07 MED ORDER — VANCOMYCIN HCL IN DEXTROSE 1-5 GM/200ML-% IV SOLN: 1000.0000 mg | Freq: Once | INTRAVENOUS | Status: DC

## 2011-09-07 MED ORDER — CEFAZOLIN SODIUM 1-5 GM-% IV SOLN: 1.0000 g | INTRAVENOUS | Status: DC

## 2011-09-07 MED ORDER — METHOCARBAMOL 500 MG PO TABS: 500.0000 mg | ORAL_TABLET | Freq: Three times a day (TID) | ORAL | Status: AC

## 2011-09-07 MED ORDER — LIDOCAINE HCL (CARDIAC) 20 MG/ML IV SOLN
INTRAVENOUS | Status: DC | PRN
  Administered 2011-09-07: 60 mg via INTRAVENOUS

## 2011-09-07 MED ORDER — VANCOMYCIN HCL 1000 MG IV SOLR
1000.0000 mg | INTRAVENOUS | Status: DC | PRN
  Administered 2011-09-07: 1000 mg via INTRAVENOUS

## 2011-09-07 MED ORDER — CHLORHEXIDINE GLUCONATE 4 % EX LIQD: 60.0000 mL | Freq: Once | CUTANEOUS | Status: DC

## 2011-09-07 MED ORDER — LACTATED RINGERS IV SOLN
INTRAVENOUS | Status: DC
  Administered 2011-09-07: 08:00:00 via INTRAVENOUS

## 2011-09-07 SURGICAL SUPPLY — 58 items
BANDAGE ELASTIC 4 VELCRO ST LF (GAUZE/BANDAGES/DRESSINGS) IMPLANT
BANDAGE ELASTIC 6 VELCRO ST LF (GAUZE/BANDAGES/DRESSINGS) IMPLANT
BANDAGE GAUZE ELAST BULKY 4 IN (GAUZE/BANDAGES/DRESSINGS) IMPLANT
BLADE CUDA 2.0 (BLADE) IMPLANT
BLADE CUDA SHAVER 3.5 (BLADE) IMPLANT
BLADE SURG 15 STRL LF DISP TIS (BLADE) ×3 IMPLANT
BLADE SURG 15 STRL SS (BLADE) ×3
BRUSH SCRUB EZ PLAIN DRY (MISCELLANEOUS) ×2 IMPLANT
BUR 3.5 LG SPHERICAL (BURR) IMPLANT
BUR CUDA 2.9 (BURR) ×2 IMPLANT
BUR GATOR 2.9 (BURR) IMPLANT
BUR OVAL 4.0 (BURR) IMPLANT
BUR SPHERICAL 2.9 (BURR) IMPLANT
BURR 3.5 LG SPHERICAL (BURR)
CANISTER OMNI JUG 16 LITER (MISCELLANEOUS) ×2 IMPLANT
CANISTER SUCTION 2500CC (MISCELLANEOUS) ×2 IMPLANT
CLOTH BEACON ORANGE TIMEOUT ST (SAFETY) ×2 IMPLANT
CUFF TOURNIQUET SINGLE 34IN LL (TOURNIQUET CUFF) ×2 IMPLANT
DRAPE ARTHROSCOPY W/POUCH 114 (DRAPES) ×2 IMPLANT
DRAPE OEC MINIVIEW 54X84 (DRAPES) IMPLANT
DRAPE SURG 17X23 STRL (DRAPES) ×2 IMPLANT
DURA STEPPER LG (CAST SUPPLIES) IMPLANT
DURA STEPPER MED (CAST SUPPLIES) IMPLANT
DURA STEPPER SML (CAST SUPPLIES) IMPLANT
ELECT REM PT RETURN 9FT ADLT (ELECTROSURGICAL) ×2
ELECTRODE REM PT RTRN 9FT ADLT (ELECTROSURGICAL) ×1 IMPLANT
GAUZE XEROFORM 1X8 LF (GAUZE/BANDAGES/DRESSINGS) ×2 IMPLANT
GLOVE BIO SURGEON STRL SZ 6.5 (GLOVE) ×4 IMPLANT
GLOVE BIO SURGEON STRL SZ8 (GLOVE) ×4 IMPLANT
GLOVE BIOGEL PI IND STRL 7.0 (GLOVE) ×1 IMPLANT
GLOVE BIOGEL PI IND STRL 8 (GLOVE) ×2 IMPLANT
GLOVE BIOGEL PI INDICATOR 7.0 (GLOVE) ×1
GLOVE BIOGEL PI INDICATOR 8 (GLOVE) ×2
GLOVE SURG SS PI 8.0 STRL IVOR (GLOVE) ×2 IMPLANT
GOWN BRE IMP PREV XXLGXLNG (GOWN DISPOSABLE) ×2 IMPLANT
GOWN PREVENTION PLUS XLARGE (GOWN DISPOSABLE) ×4 IMPLANT
NS IRRIG 1000ML POUR BTL (IV SOLUTION) IMPLANT
PACK ARTHROSCOPY DSU (CUSTOM PROCEDURE TRAY) ×2 IMPLANT
PACK BASIN DAY SURGERY FS (CUSTOM PROCEDURE TRAY) ×2 IMPLANT
PADDING CAST ABS 4INX4YD NS (CAST SUPPLIES) ×1
PADDING CAST ABS COTTON 4X4 ST (CAST SUPPLIES) ×1 IMPLANT
PENCIL BUTTON HOLSTER BLD 10FT (ELECTRODE) IMPLANT
SPONGE GAUZE 4X4 12PLY (GAUZE/BANDAGES/DRESSINGS) ×2 IMPLANT
STOCKINETTE 6  STRL (DRAPES) ×1
STOCKINETTE 6 STRL (DRAPES) ×1 IMPLANT
STRAP ANKLE FOOT DISTRACTOR (ORTHOPEDIC SUPPLIES) IMPLANT
SUCTION FRAZIER TIP 10 FR DISP (SUCTIONS) IMPLANT
SUT ETHILON 4 0 PS 2 18 (SUTURE) ×2 IMPLANT
SUT VIC AB 3-0 PS1 18 (SUTURE)
SUT VIC AB 3-0 PS1 18XBRD (SUTURE) IMPLANT
SYR BULB 3OZ (MISCELLANEOUS) IMPLANT
TOWEL OR 17X24 6PK STRL BLUE (TOWEL DISPOSABLE) ×2 IMPLANT
TOWEL OR NON WOVEN STRL DISP B (DISPOSABLE) ×2 IMPLANT
TUBE CONNECTING 20X1/4 (TUBING) ×4 IMPLANT
TUBING ARTHROSCOPY IRRIG 16FT (MISCELLANEOUS) ×2 IMPLANT
UNDERPAD 30X30 INCONTINENT (UNDERPADS AND DIAPERS) ×2 IMPLANT
WAND SHORT BEVEL W/CORD (SURGICAL WAND) ×2 IMPLANT
WATER STERILE IRR 1000ML POUR (IV SOLUTION) ×2 IMPLANT

## 2011-09-07 NOTE — Anesthesia Procedure Notes (Addendum)
Anesthesia Regional Block:  Popliteal block  Pre-Anesthetic Checklist: ,, timeout performed, Correct Patient, Correct Site, Correct Laterality, Correct Procedure, Correct Position, site marked, Risks and benefits discussed, pre-op evaluation, post-op pain management  Laterality: Right  Prep: Maximum Sterile Barrier Precautions used and chloraprep       Needles:  Injection technique: Single-shot  Needle Type: Echogenic Stimulator Needle          Additional Needles:  Procedures: ultrasound guided and nerve stimulator Popliteal block  Nerve Stimulator or Paresthesia:  Response: Peroneal, 0.6 mA,  Response: Tibial, 0.6 mA,   Additional Responses:   Narrative:  Start time: 09/07/2011 8:11 AM End time: 09/07/2011 8:25 AM Injection made incrementally with aspirations every 5 mL. Anesthesiologist: Sampson Goon, MD  Additional Notes: 2% Lidocaine skin wheel. Saphenous block with 10cc of 0.5% Ropivicaine above the ankle.  Popliteal block Procedure Name: LMA Insertion Date/Time: 09/07/2011 8:50 AM Performed by: Meyer Russel Pre-anesthesia Checklist: Patient identified, Emergency Drugs available, Suction available and Patient being monitored Patient Re-evaluated:Patient Re-evaluated prior to inductionOxygen Delivery Method: Circle System Utilized Preoxygenation: Pre-oxygenation with 100% oxygen Intubation Type: IV induction Ventilation: Mask ventilation without difficulty LMA: LMA inserted LMA Size: 4.0 Number of attempts: 1 Airway Equipment and Method: bite block Placement Confirmation: positive ETCO2 and breath sounds checked- equal and bilateral Tube secured with: Tape Dental Injury: Teeth and Oropharynx as per pre-operative assessment

## 2011-09-07 NOTE — H&P (Signed)
  H&P documentation: Placed to be scanned history and physical exam in chart.  -History and Physical Reviewed  -Patient has been re-examined  -No change in the plan of care  Margaret Gay A  

## 2011-09-07 NOTE — Discharge Instructions (Addendum)
  Post Anesthesia Home Care Instructions  Activity: Get plenty of rest for the remainder of the day. A responsible adult should stay with you for 24 hours following the procedure.  For the next 24 hours, DO NOT: -Drive a car -Advertising copywriter -Drink alcoholic beverages -Take any medication unless instructed by your physician -Make any legal decisions or sign important papers.  Meals: Start with liquid foods such as gelatin or soup. Progress to regular foods as tolerated. Avoid greasy, spicy, heavy foods. If nausea and/or vomiting occur, drink only clear liquids until the nausea and/or vomiting subsides. Call your physician if vomiting continues.  Special Instructions/Symptoms: Your throat may feel dry or sore from the anesthesia or the breathing tube placed in your throat during surgery. If this causes discomfort, gargle with warm salt water. The discomfort should disappear within 24 hours.   Call your surgeon if you experience:   1.  Fever over 101.0. 2.  Inability to urinate. 3.  Nausea and/or vomiting. 4.  Extreme swelling or bruising at the surgical site. 5.  Continued bleeding from the incision. 6.  Increased pain, redness or drainage from the incision. 7.  Problems related to your pain medication.   Regional Anesthesia Blocks  1. Numbness or the inability to move the "blocked" extremity may last from 3-48 hours after placement. The length of time depends on the medication injected and your individual response to the medication. If the numbness is not going away after 48 hours, call your surgeon.  2. The extremity that is blocked will need to be protected until the numbness is gone and the  Strength has returned. Because you cannot feel it, you will need to take extra care to avoid injury. Because it may be weak, you may have difficulty moving it or using it. You may not know what position it is in without looking at it while the block is in effect.  3. For blocks in the legs  and feet, returning to weight bearing and walking needs to be done carefully. You will need to wait until the numbness is entirely gone and the strength has returned. You should be able to move your leg and foot normally before you try and bear weight or walk. You will need someone to be with you when you first try to ensure you do not fall and possibly risk injury.  4. Bruising and tenderness at the needle site are common side effects and will resolve in a few days.  5. Persistent numbness or new problems with movement should be communicated to the surgeon or the [facility] [phone] [facility] [phone]).

## 2011-09-07 NOTE — Brief Op Note (Signed)
09/07/2011  10:09 AM  PATIENT:  Margaret Gay  46 y.o. female  PRE-OPERATIVE DIAGNOSIS:  right ankle ligament impingement  POST-OPERATIVE DIAGNOSIS:  same as preop  PROCEDURE:  Procedure(s) (LRB): ANKLE ARTHROSCOPY (Right)  SURGEON:  Surgeon(s) and Role:    * Sherri Rad, MD - Primary  PHYSICIAN ASSISTANT: Rexene Edison, PAC   ASSISTANTS: Above   ANESTHESIA:   general  EBL:  Total I/O In: 200 [I.V.:200] Out: -   BLOOD ADMINISTERED: No  DRAINS: none   LOCAL MEDICATIONS USED:  NONE  SPECIMEN:  No Specimen  DISPOSITION OF SPECIMEN:  N/A  COUNTS:  YES  TOURNIQUET:   Total Tourniquet Time Documented: Thigh (Right) - 60 minutes  DICTATION: .Other Dictation: Dictation Number [id number]  PLAN OF CARE: Discharge to home after PACU  PATIENT DISPOSITION:  PACU - hemodynamically stable.   Delay start of Pharmacological VTE agent (>24hrs) due to surgical blood loss or risk of bleeding: no

## 2011-09-07 NOTE — Anesthesia Preprocedure Evaluation (Addendum)
Anesthesia Evaluation  Patient identified by MRN, date of birth, ID band Patient awake    Reviewed: Allergy & Precautions, H&P , NPO status , Patient's Chart, lab work & pertinent test results  Airway Mallampati: II TM Distance: >3 FB Neck ROM: Full    Dental No notable dental hx. (+) Edentulous Upper and Edentulous Lower   Pulmonary asthma , sleep apnea and Continuous Positive Airway Pressure Ventilation ,  breath sounds clear to auscultation  Pulmonary exam normal       Cardiovascular negative cardio ROS  Rhythm:Regular Rate:Normal     Neuro/Psych  Headaches, Seizures -, Well Controlled,  TIA Neuromuscular disease negative psych ROS   GI/Hepatic Neg liver ROS, GERD-  Medicated and Controlled,  Endo/Other  negative endocrine ROS  Renal/GU negative Renal ROS  negative genitourinary   Musculoskeletal   Abdominal   Peds  Hematology negative hematology ROS (+)   Anesthesia Other Findings   Reproductive/Obstetrics negative OB ROS                           Anesthesia Physical Anesthesia Plan  ASA: III  Anesthesia Plan: General   Post-op Pain Management:    Induction: Intravenous  Airway Management Planned: LMA  Additional Equipment:   Intra-op Plan:   Post-operative Plan: Extubation in OR  Informed Consent: I have reviewed the patients History and Physical, chart, labs and discussed the procedure including the risks, benefits and alternatives for the proposed anesthesia with the patient or authorized representative who has indicated his/her understanding and acceptance.   Dental Advisory Given  Plan Discussed with: CRNA  Anesthesia Plan Comments:        Anesthesia Quick Evaluation

## 2011-09-07 NOTE — Transfer of Care (Signed)
Immediate Anesthesia Transfer of Care Note  Patient: Margaret Gay  Procedure(s) Performed: Procedure(s) (LRB): ANKLE ARTHROSCOPY (Right)  Patient Location: PACU  Anesthesia Type: GA combined with regional for post-op pain  Level of Consciousness: awake, oriented and patient cooperative  Airway & Oxygen Therapy: Patient Spontanous Breathing and Patient connected to face mask oxygen  Post-op Assessment: Report given to PACU RN, Post -op Vital signs reviewed and stable and Patient moving all extremities  Post vital signs: Reviewed and stable  Complications: No apparent anesthesia complications

## 2011-09-07 NOTE — Progress Notes (Signed)
Assisted Dr. Fitzgerald with right, ultrasound guided, popliteal block. Side rails up, monitors on throughout procedure. See vital signs in flow sheet. Tolerated Procedure well. 

## 2011-09-07 NOTE — Anesthesia Postprocedure Evaluation (Signed)
  Anesthesia Post-op Note  Patient: Margaret Gay  Procedure(s) Performed: Procedure(s) (LRB): ANKLE ARTHROSCOPY (Right)  Patient Location: PACU  Anesthesia Type: GA combined with regional for post-op pain  Level of Consciousness: awake  Airway and Oxygen Therapy: Patient Spontanous Breathing  Post-op Pain: mild  Post-op Assessment: Post-op Vital signs reviewed, Patient's Cardiovascular Status Stable, Respiratory Function Stable, Patent Airway and No signs of Nausea or vomiting  Post-op Vital Signs: Reviewed and stable  Complications: No apparent anesthesia complications

## 2011-09-08 NOTE — Op Note (Signed)
Margaret Gay, Margaret Gay                   ACCOUNT NO.:  [id number]  MEDICAL RECORD NO.:  [id number]  LOCATION:                                 FACILITY:  PHYSICIAN:  Leonides Grills, M.D.          DATE OF BIRTH:  DATE OF PROCEDURE:  09/07/2011 DATE OF DISCHARGE:                              OPERATIVE REPORT   PREOPERATIVE DIAGNOSES: 1. Right anterolateral ankle impingement. 2. Right sinus tarsi impingement.  POSTOPERATIVE DIAGNOSES: 1. Right anterolateral ankle impingement. 2. Right sinus tarsi impingement.  OPERATION: 1. Right ankle arthroscopy with extensive debridement. 2. Right subtalar arthrotomy with tenosynovectomy.  ANESTHESIA:  General.  SURGEON:  Leonides Grills, M.D.  ASSISTANT:  Richardean Canal, P.A.  ESTIMATED BLOOD LOSS:  Minimal.  TOURNIQUET TIME:  Approximately an hour.  COMPLICATIONS:  None.  DISPOSITION:  Stable to PR.  INDICATION:  This is a 46 year old female who was injured at work and had an ankle arthroscopy with extensive debridement previously.  She had recurrent anterolateral ankle pain and sinus tarsi pain that was interfering with her life.  She was consented for the above procedure. All risks of infection or vessel injury, persistent pain, worsening pain, prolonged recovery, wound healing problems, stiffness, arthritis, DVT, PE were all explained.  Questions were encouraged and answered.  OPERATION:  The patient was brought to the operating room, placed in supine position.  After adequate general anesthesia was administered as well as Ancef 1 g IV piggyback.  Bump was placed on right ipsilateral hip internally rotating right lower extremity.  All bony prominences were well padded.  Right lower extremity was then prepped and draped in the sterile manner over a proximally placed thigh tourniquet.  Limb was gravity exsanguinated and tourniquet was elevated to 200 mmHg.  A small curvilinear incision over the sinus tarsi was then made.  Dissection  was carried down through skin.  Hemostasis was obtained.  Careful dissection was carried down to the subtalar joint.  Subtalar arthrotomy was then made.  There was a large amount of scar and synovitis in this area. This was then debrided with a rongeur as well as sharply with the scalpel.  The subtalar joint was ranged, and there was no impingement in this area.  The area was copiously irrigated with normal saline.  We then placed a spinal needle just medial to the anterior tibialis tendon 20 mL of normal saline instilled in the ankle.  Weston Brass and spread technique was then utilized to create the anteromedial, portal medial to the anterior tibialis tendon.  Blunt tip trocar with cannula followed by camera was then placed in the ankle.  Under direct visualization, anterolateral portal was created with spinal needle followed by nick and spread technique lateral to the peroneus tertius tendon and superficial peroneal nerve, and there was a large amount of scar on the anterolateral aspect of the ankle and a wisp of scar that extended anteriorly over the anterior aspect of the ankle.  This was all extensively debrided with a shaver as well as radiofrequency bevel. Hemostasis was obtained.  The ankle was ranged.  There was no impingement in this area.  Pictures were  obtained throughout the procedure.  Camera was placed anterolaterally visualizing anteromedially.  Again, there was a small area of scar with the dorsiflexion of the ankle but this was also debrided as well.  There was no obvious osteochondral lesions throughout the talar dome or tibial plafond.  Again, pictures were obtained throughout the procedure. Camera was removed.  Wound was closed with 4-0 nylon stitch.  Sterile dressing was applied.  CAM walker boot was applied.  The patient was stable to the PAR.     Leonides Grills, M.D.     PB/MEDQ  D:  09/07/2011  T:  09/07/2011  Job:  [id number]

## 2011-09-09 ENCOUNTER — Encounter (HOSPITAL_BASED_OUTPATIENT_CLINIC_OR_DEPARTMENT_OTHER): Payer: Self-pay | Admitting: Orthopedic Surgery

## 2011-09-13 ENCOUNTER — Encounter (HOSPITAL_BASED_OUTPATIENT_CLINIC_OR_DEPARTMENT_OTHER): Payer: Self-pay

## 2016-09-06 ENCOUNTER — Emergency Department: Admission: EM | Admit: 2016-09-06 | Payer: Medicare Other | Source: Home / Self Care

## 2019-07-05 LAB — HEMOGLOBIN A1C: Hemoglobin A1C: 5.8

## 2019-07-05 LAB — LIPID PANEL
Cholesterol: 215 — AB (ref 0–200)
HDL: 63 (ref 35–70)
Triglycerides: 165 — AB (ref 40–160)

## 2019-12-17 ENCOUNTER — Other Ambulatory Visit: Payer: Self-pay

## 2019-12-17 ENCOUNTER — Encounter: Payer: Self-pay | Admitting: "Endocrinology

## 2019-12-17 ENCOUNTER — Other Ambulatory Visit: Payer: Self-pay | Admitting: "Endocrinology

## 2019-12-17 ENCOUNTER — Ambulatory Visit (INDEPENDENT_AMBULATORY_CARE_PROVIDER_SITE_OTHER): Payer: No Typology Code available for payment source | Admitting: "Endocrinology

## 2019-12-17 VITALS — BP 112/72 | HR 64 | Ht 63.5 in | Wt 216.0 lb

## 2019-12-17 DIAGNOSIS — E049 Nontoxic goiter, unspecified: Secondary | ICD-10-CM

## 2019-12-17 DIAGNOSIS — E039 Hypothyroidism, unspecified: Secondary | ICD-10-CM

## 2019-12-17 DIAGNOSIS — E042 Nontoxic multinodular goiter: Secondary | ICD-10-CM | POA: Insufficient documentation

## 2019-12-17 NOTE — Progress Notes (Signed)
Endocrinology Consult Note                                            12/17/2019, 1:11 PM   Subjective:    Patient ID: Margaret Gay, female    DOB: 15-Jun-1965, PCP Apolonio Schneiders, MD   Past Medical History:  Diagnosis Date  . Asthma   . Fibromyalgia   . GERD (gastroesophageal reflux disease)   . Headache(784.0)   . Seasonal allergies   . Seizures ([place])   . Sleep apnea    uses cpap  . TIA (transient ischemic attack)    Past Surgical History:  Procedure Laterality Date  . ABDOMINAL HYSTERECTOMY    . ANKLE ARTHROSCOPY     rt x2  . ANKLE ARTHROSCOPY  09/07/2011   Procedure: ANKLE ARTHROSCOPY;  Surgeon: Colin Rhein, MD;  Location: [facility];  Service: Orthopedics;  Laterality: Right;  subtalar arthrotomy with tenosynovectomy with extensive debridement  . APPENDECTOMY    . OOPHORECTOMY     lt   Social History   Socioeconomic History  . Marital status: Single    Spouse name: Not on file  . Number of children: Not on file  . Years of education: Not on file  . Highest education level: Not on file  Occupational History  . Not on file  Tobacco Use  . Smoking status: Never Smoker  Vaping Use  . Vaping Use: Never used  Substance and Sexual Activity  . Alcohol use: No  . Drug use: No  . Sexual activity: Not on file  Other Topics Concern  . Not on file  Social History Narrative  . Not on file   Social Determinants of Health   Financial Resource Strain:   . Difficulty of Paying Living Expenses:   Food Insecurity:   . Worried About Charity fundraiser in the Last Year:   . Arboriculturist in the Last Year:   Transportation Needs:   . Film/video editor (Medical):   Marland Kitchen Lack of Transportation (Non-Medical):   Physical Activity:   . Days of Exercise per Week:   . Minutes of Exercise per Session:   Stress:   . Feeling of Stress :   Social Connections:   . Frequency of Communication with Friends and Family:   . Frequency of Social  Gatherings with Friends and Family:   . Attends Religious Services:   . Active Member of Clubs or Organizations:   . Attends Archivist Meetings:   Marland Kitchen Marital Status:    Family History  Problem Relation Age of Onset  . Cancer Mother   . Hypertension Father   . Heart attack Father    Outpatient Encounter Medications as of 12/17/2019  Medication Sig  . acetaminophen (TYLENOL) 650 MG CR tablet Take 650 mg by mouth every 8 (eight) hours as needed for pain.  . budesonide-formoterol (SYMBICORT) 160-4.5 MCG/ACT inhaler Inhale 2 puffs into the lungs 2 (two) times daily.  Marland Kitchen levothyroxine (SYNTHROID) 50 MCG tablet Take 50 mcg by mouth daily before breakfast.  . tiZANidine (ZANAFLEX) 4 MG capsule Take 4 mg by mouth at bedtime.  Marland Kitchen albuterol (PROVENTIL HFA;VENTOLIN HFA) 108 (90 BASE) MCG/ACT inhaler Inhale 2 puffs into the lungs every 6 (six) hours as needed.  Marland Kitchen amLODipine (NORVASC) 5 MG tablet Take 5  mg by mouth daily.  . carboxymethylcellulose (REFRESH PLUS) 0.5 % SOLN Place 1 drop into both eyes 3 (three) times daily as needed.  . cholecalciferol (VITAMIN D) 25 MCG (1000 UNIT) tablet Take 1,000 Units by mouth daily.  . fexofenadine (ALLEGRA) 180 MG tablet Take 180 mg by mouth daily.  . lansoprazole (PREVACID) 30 MG capsule Take 30 mg by mouth 2 (two) times daily.  Marland Kitchen lidocaine (LIDODERM) 5 % 2 patches daily.  . RESTASIS 0.05 % ophthalmic emulsion   . topiramate (TOPAMAX) 100 MG tablet Take 100 mg by mouth 3 (three) times daily.  . [DISCONTINUED] gabapentin (NEURONTIN) 300 MG capsule Take 300 mg by mouth 3 (three) times daily.  . [DISCONTINUED] SUMAtriptan (IMITREX) 100 MG tablet Take 100 mg by mouth every 2 (two) hours as needed.   No facility-administered encounter medications on file as of 12/17/2019.   ALLERGIES: Allergies  Allergen Reactions  . Compazine [Prochlorperazine Edisylate]     Increase bp  . Erythromycin Itching  . Penicillins Swelling  . Voltaren [Diclofenac Sodium]      Gi upset    VACCINATION STATUS:  There is no immunization history on file for this patient.  HPI Margaret Gay is 54 y.o. female who presents today with a medical history as above. she is being seen in consultation for nodular goiter requested by Apolonio Schneiders, MD. Patient is not an optimal historian, history is obtained by chart review and interviewing the patient.   -She is known to have nodular goiter at least since 2013.  Her most recent ultrasound showed increase in the size of nodules on bilateral thyroid lobes.  She did not undergo any fine-needle aspiration biopsy in the past.  She is on levothyroxine 50 mcg p.o. daily.  She does not have recent thyroid function tests.  She denies dysphagia, shortness of breath, nor voice change.  She denies any recent major weight change.  She denies palpitations, tremors, heat/cold intolerance.  She denies family history of thyroid malignancy.   Review of Systems  Constitutional: no recent weight gain/loss, no fatigue, no subjective hyperthermia, no subjective hypothermia Eyes: no blurry vision, no xerophthalmia ENT: no sore throat, no nodules palpated in throat, no dysphagia/odynophagia, no hoarseness Cardiovascular: no Chest Pain, no Shortness of Breath, no palpitations, no leg swelling Respiratory: no cough, no shortness of breath Gastrointestinal: no Nausea/Vomiting/Diarhhea Musculoskeletal: no muscle/joint aches Skin: no rashes Neurological: no tremors, no numbness, no tingling, no dizziness Psychiatric: no depression, no anxiety  Objective:    Vitals with BMI 12/17/2019 09/07/2011 09/07/2011  Height 5' 3.5" - -  Weight 216 lbs - -  BMI 44.96 - -  Systolic 759 163 846  Diastolic 72 78 81  Pulse 64 83 71    BP 112/72   Pulse 64   Ht 5' 3.5" (1.613 m)   Wt 216 lb (98 kg)   BMI 37.66 kg/m   Wt Readings from Last 3 Encounters:  12/17/19 216 lb (98 kg)  09/05/11 210 lb (95.3 kg)    Physical Exam  Constitutional:  Body mass  index is 37.66 kg/m.,  not in acute distress, normal state of mind Eyes: PERRLA, EOMI, no exophthalmos ENT: moist mucous membranes,  + gross thyromegaly, no gross cervical lymphadenopathy Cardiovascular: normal precordial activity, Regular Rate and Rhythm, no Murmur/Rubs/Gallops Respiratory:  adequate breathing efforts, no gross chest deformity, Clear to auscultation bilaterally Gastrointestinal: abdomen soft, Non -tender, No distension, Bowel Sounds present, no gross organomegaly Musculoskeletal: + BS bilateral lower extremity braces.  Skin: moist, warm, no rashes Neurological: no tremor with outstretched hands, Deep tendon reflexes normal in bilateral lower extremities.  CMP ( most recent) CMP     Component Value Date/Time   NA 143 01/21/2010 1648   K 3.3 (L) 01/21/2010 1648   CL 117 (H) 01/21/2010 1648   CO2 19 01/21/2010 1648   GLUCOSE 141 (H) 01/21/2010 1648   BUN 21 01/21/2010 1648   CREATININE 1.17 01/21/2010 1648   CALCIUM 8.6 01/21/2010 1648   PROT 7.4 12/22/2009 1900   ALBUMIN 4.1 12/22/2009 1900   AST 15 12/22/2009 1900   ALT 10 12/22/2009 1900   ALKPHOS 66 12/22/2009 1900   BILITOT 0.4 12/22/2009 1900   GFRNONAA 50 (L) 01/21/2010 1648   GFRAA  01/21/2010 1648    >60        The eGFR has been calculated using the MDRD equation. This calculation has not been validated in all clinical situations. eGFR's persistently <60 mL/min signify possible Chronic Kidney Disease.     Diabetic Labs (most recent): Lab Results  Component Value Date   HGBA1C 5.8 07/05/2019     Lipid Panel ( most recent) Lipid Panel     Component Value Date/Time   CHOL 215 (A) 07/05/2019 0000   TRIG 165 (A) 07/05/2019 0000   HDL 63 07/05/2019 0000   Reviewing her referral package for thyroid ultrasound from November 12, 2019 compared to another study on August 11, 2014: The right lobe measured 3.6 x 4.3 x 6.6 cm.  Thyroid isthmus 0.2 cm.  Left thyroid lobe measured 1.6 x 2.5 x 6.3 cm.   Multiple nodules in both thyroid lobes.  Right thyroid lobe nodule 1 mildly heterogeneous and hypoechoic 1.1 x 2.7 x 2.9 cm, 1.3 x 1.4 x 1.9 cm on the prior exam.  Nodule 2- heterogeneous interpolar nodule measuring 2.8 x 2.6 x 3.9 cm, 3.3 cm in the prior exam.  The lesion is hypovascular and  no microcalcifications were identified.    Left thyroid lobe nodule 1- 0.4 x 0.5 x 0.6 cm not definitively identified on the prior exam.  Ideally to well defined in follow-up of lung nodule measuring 0.3 x 0.5 x 0.7 cm, approximately 0.5 cm on the prior exam.  Nodule 3- 0.6 x 0.6 x 0.7 cm medially in the lower pole, previously measured 0.7 cm and appeared unchanged.  Nodule 4-  0.7 x 0.7 x 0.8 cm hypoechoic nodule in the lower pole previously measuring 0.6 x 0.7 x 0.9 cm.  Assessment & Plan:   1. Nodular goiter  - Margaret Gay  is being seen at a kind request of Apolonio Schneiders, MD. - I have reviewed her available thyroid records and clinically evaluated the patient. - Based on these reviews, she has multinodular goiter, hypothyroidism,  however,  there is not sufficient information to proceed with definitive treatment plan.  - she will need a repeat, full profile thyroid function tests today.  In the meantime she is advised to continue levothyroxine 50 mcg daily before breakfast.    - We discussed about the correct intake of her thyroid hormone, on empty stomach at fasting, with water, separated by at least 30 minutes from breakfast and other medications,  and separated by more than 4 hours from calcium, iron, multivitamins, acid reflux medications (PPIs). -Patient is made aware of the fact that thyroid hormone replacement is needed for life, dose to be adjusted by periodic monitoring of thyroid function tests.  Regarding her multinodular goiter:  In light of the interval enlargement in the size of her nodules, she will need fine-needle aspiration of largest/concerning nodules on bilateral thyroid lobes to  rule out malignancy.  This procedure will be scheduled to be performed in [facility] radiology. She denies any compressive symptoms.  Next steps will depend on biopsy results.  - I did not initiate any new prescriptions today. - she is advised to maintain close follow up with Apolonio Schneiders, MD for primary care needs.   - Time spent with the patient: 60 minutes, of which >50% was spent in  counseling her about her multinodular goiter, hypothyroidism and the rest in obtaining information about her symptoms, reviewing her previous labs/studies ( including abstractions from other facilities),  evaluations, and treatments,  and developing a plan to confirm diagnosis and long term treatment based on the latest standards of care/guidelines; and documenting her care.  Margaret Gay participated in the discussions, expressed understanding, and voiced agreement with the above plans.  All questions were answered to her satisfaction. she is encouraged to contact clinic should she have any questions or concerns prior to her return visit.  Follow up plan: Return in about 2 weeks (around 12/31/2019) for Labs Today- Non-Fasting Ok, F/U with Biopsy Results.   Glade Lloyd, MD [facility] [address] [place], [place] [address] Phone: [phone]  Fax: [phone]     12/17/2019, 1:11 PM  This note was partially dictated with voice recognition software. Similar sounding words can be transcribed inadequately or may not  be corrected upon review.

## 2019-12-18 LAB — T4, FREE: Free T4: 0.99 ng/dL (ref 0.82–1.77)

## 2019-12-18 LAB — THYROGLOBULIN ANTIBODY: Thyroglobulin Antibody: <1 IU/mL (ref 0.0–0.9)

## 2019-12-18 LAB — TSH: TSH: 0.702 u[IU]/mL (ref 0.450–4.500)

## 2019-12-18 LAB — THYROID PEROXIDASE ANTIBODY: Thyroperoxidase Ab SerPl-aCnc: 18 IU/mL (ref 0–34)

## 2019-12-26 ENCOUNTER — Other Ambulatory Visit: Payer: Self-pay

## 2019-12-26 ENCOUNTER — Ambulatory Visit (HOSPITAL_COMMUNITY)
Admission: RE | Admit: 2019-12-26 | Discharge: 2019-12-26 | Disposition: A | Discharge status: Home / Self Care | Payer: No Typology Code available for payment source | Source: Ambulatory Visit | Attending: "Endocrinology | Admitting: "Endocrinology

## 2019-12-26 DIAGNOSIS — E049 Nontoxic goiter, unspecified: Secondary | ICD-10-CM | POA: Diagnosis not present

## 2020-01-03 ENCOUNTER — Ambulatory Visit: Payer: Non-veteran care | Admitting: "Endocrinology

## 2020-01-07 ENCOUNTER — Other Ambulatory Visit: Payer: Self-pay | Admitting: "Endocrinology

## 2020-01-07 DIAGNOSIS — E049 Nontoxic goiter, unspecified: Secondary | ICD-10-CM

## 2020-01-09 ENCOUNTER — Encounter (HOSPITAL_COMMUNITY): Payer: Self-pay

## 2020-01-09 ENCOUNTER — Other Ambulatory Visit (HOSPITAL_COMMUNITY): Payer: Self-pay | Admitting: Diagnostic Radiology

## 2020-01-09 ENCOUNTER — Other Ambulatory Visit: Payer: Self-pay

## 2020-01-09 ENCOUNTER — Ambulatory Visit (HOSPITAL_COMMUNITY)
Admission: RE | Admit: 2020-01-09 | Discharge: 2020-01-09 | Disposition: A | Discharge status: Home / Self Care | Payer: No Typology Code available for payment source | Source: Ambulatory Visit | Attending: "Endocrinology | Admitting: "Endocrinology

## 2020-01-09 DIAGNOSIS — E049 Nontoxic goiter, unspecified: Secondary | ICD-10-CM

## 2020-01-09 DIAGNOSIS — E042 Nontoxic multinodular goiter: Secondary | ICD-10-CM | POA: Insufficient documentation

## 2020-01-09 MED ORDER — LIDOCAINE HCL (PF) 2 % IJ SOLN
INTRAMUSCULAR | Status: AC
  Filled 2020-01-09: qty 10

## 2020-01-09 NOTE — Procedures (Signed)
PreOperative Dx: 2 RIGHT thyroid nodule Postoperative Dx: 2 RIGHT thyroid nodule Procedure:   US guided FNA of 2 RIGHT thyroid nodules Radiologist:  Tyron Russell Anesthesia:  6 ml of 2% lidocaine Specimen:  FNA x 5 of each nodule, 3 for cytology and 2 for Afirma  EBL:   < 1 ml Complications: None

## 2020-01-13 ENCOUNTER — Encounter: Payer: Self-pay | Admitting: "Endocrinology

## 2020-01-13 ENCOUNTER — Other Ambulatory Visit: Payer: Self-pay

## 2020-01-13 ENCOUNTER — Ambulatory Visit (INDEPENDENT_AMBULATORY_CARE_PROVIDER_SITE_OTHER): Payer: No Typology Code available for payment source | Admitting: "Endocrinology

## 2020-01-13 VITALS — BP 100/68 | HR 68 | Ht 63.5 in | Wt 216.8 lb

## 2020-01-13 DIAGNOSIS — E049 Nontoxic goiter, unspecified: Secondary | ICD-10-CM

## 2020-01-13 DIAGNOSIS — E039 Hypothyroidism, unspecified: Secondary | ICD-10-CM

## 2020-01-13 NOTE — Progress Notes (Signed)
01/13/2020, 11:25 AM  Endocrinology follow-up note  Subjective:    Patient ID: Margaret Gay, female    DOB: 08-20-1965, PCP Margaret Schneiders, MD   Past Medical History:  Diagnosis Date  . Asthma   . Fibromyalgia   . GERD (gastroesophageal reflux disease)   . Headache(784.0)   . Seasonal allergies   . Seizures ([place])   . Sleep apnea    uses cpap  . TIA (transient ischemic attack)    Past Surgical History:  Procedure Laterality Date  . ABDOMINAL HYSTERECTOMY    . ANKLE ARTHROSCOPY     rt x2  . ANKLE ARTHROSCOPY  09/07/2011   Procedure: ANKLE ARTHROSCOPY;  Surgeon: Colin Rhein, MD;  Location: [facility];  Service: Orthopedics;  Laterality: Right;  subtalar arthrotomy with tenosynovectomy with extensive debridement  . APPENDECTOMY    . OOPHORECTOMY     lt   Social History   Socioeconomic History  . Marital status: Single    Spouse name: Not on file  . Number of children: Not on file  . Years of education: Not on file  . Highest education level: Not on file  Occupational History  . Not on file  Tobacco Use  . Smoking status: Never Smoker  Vaping Use  . Vaping Use: Never used  Substance and Sexual Activity  . Alcohol use: No  . Drug use: No  . Sexual activity: Not on file  Other Topics Concern  . Not on file  Social History Narrative  . Not on file   Social Determinants of Health   Financial Resource Strain:   . Difficulty of Paying Living Expenses: Not on file  Food Insecurity:   . Worried About Charity fundraiser in the Last Year: Not on file  . Ran Out of Food in the Last Year: Not on file  Transportation Needs:   . Lack of Transportation (Medical): Not on file  . Lack of Transportation (Non-Medical): Not on file  Physical Activity:   . Days of Exercise per Week: Not on file  . Minutes of Exercise per Session: Not on file  Stress:   . Feeling of Stress : Not on file  Social  Connections:   . Frequency of Communication with Friends and Family: Not on file  . Frequency of Social Gatherings with Friends and Family: Not on file  . Attends Religious Services: Not on file  . Active Member of Clubs or Organizations: Not on file  . Attends Archivist Meetings: Not on file  . Marital Status: Not on file   Family History  Problem Relation Age of Onset  . Cancer Mother   . Hypertension Father   . Heart attack Father    Outpatient Encounter Medications as of 01/13/2020  Medication Sig  . aspirin EC 81 MG tablet Take 81 mg by mouth daily. Swallow whole.  . Calcium Carbonate (CALCIUM 500 PO) Take 2 tablets by mouth daily.  . famotidine (PEPCID) 20 MG tablet Take 20 mg by mouth daily.  . fluticasone (FLONASE) 50 MCG/ACT nasal spray Place into both nostrils daily.  Marland Kitchen acetaminophen (TYLENOL) 650 MG CR tablet Take 650 mg by mouth every 8 (eight)  hours as needed for pain.  Marland Kitchen albuterol (PROVENTIL HFA;VENTOLIN HFA) 108 (90 BASE) MCG/ACT inhaler Inhale 2 puffs into the lungs every 6 (six) hours as needed.  Marland Kitchen amLODipine (NORVASC) 5 MG tablet Take 5 mg by mouth daily.  . budesonide-formoterol (SYMBICORT) 160-4.5 MCG/ACT inhaler Inhale 2 puffs into the lungs 2 (two) times daily.  . carboxymethylcellulose (REFRESH PLUS) 0.5 % SOLN Place 1 drop into both eyes 3 (three) times daily as needed.  . cholecalciferol (VITAMIN D) 25 MCG (1000 UNIT) tablet Take 1,000 Units by mouth daily.  . fexofenadine (ALLEGRA) 180 MG tablet Take 180 mg by mouth daily.  Marland Kitchen levothyroxine (SYNTHROID) 50 MCG tablet Take 50 mcg by mouth daily before breakfast.  . lidocaine (LIDODERM) 5 % 2 patches daily.  . RESTASIS 0.05 % ophthalmic emulsion   . tiZANidine (ZANAFLEX) 4 MG capsule Take 4 mg by mouth at bedtime.  . topiramate (TOPAMAX) 100 MG tablet Take 100 mg by mouth 3 (three) times daily.  . [DISCONTINUED] lansoprazole (PREVACID) 30 MG capsule Take 30 mg by mouth 2 (two) times daily.   No  facility-administered encounter medications on file as of 01/13/2020.   ALLERGIES: Allergies  Allergen Reactions  . Compazine [Prochlorperazine Edisylate]     Increase bp  . Erythromycin Itching  . Penicillins Swelling  . Voltaren [Diclofenac Sodium]     Gi upset    VACCINATION STATUS:  There is no immunization history on file for this patient.  HPI Margaret Gay is 54 y.o. female who was recently seen in consultation for multinodular goiter and hypothyroidism.  She is returning for a follow-up with repeat thyroid function test.  Her biopsy results are not ready to review.  -Original consult was requested by Margaret Schneiders, MD. Patient is not an optimal historian, history is obtained by chart review and interviewing the patient.   -She is known to have nodular goiter at least since 2013.  Her most recent ultrasound showed increase in the size of nodules on bilateral thyroid lobes.  She just underwent fine-needle aspiration biopsy of bilateral thyroid nodules on January 09, 2020, results are pending.  She tolerated the procedure very well, does not have any new complaints today.  She denies dysphagia, shortness of breath, nor voice change.  She is on levothyroxine 50 mcg p.o. daily.  Her previsit thyroid function tests are consistent with appropriate replacement.   She denies any recent major weight change.  She denies palpitations, tremors, heat/cold intolerance.  She denies family history of thyroid malignancy.   Review of Systems    Objective:    Vitals with BMI 01/13/2020 01/09/2020 12/17/2019  Height 5' 3.5" - 5' 3.5"  Weight 216 lbs 13 oz - 216 lbs  BMI 72.9 - 02.11  Systolic 155 208 022  Diastolic 68 78 72  Pulse 68 60 64    BP 100/68   Pulse 68   Ht 5' 3.5" (1.613 m)   Wt 216 lb 12.8 oz (98.3 kg)   BMI 37.80 kg/m   Wt Readings from Last 3 Encounters:  01/13/20 216 lb 12.8 oz (98.3 kg)  12/17/19 216 lb (98 kg)  09/05/11 210 lb (95.3 kg)    Physical  Exam  Constitutional:  Body mass index is 37.8 kg/m.,  not in acute distress, normal state of mind Eyes: PERRLA, EOMI, no exophthalmos ENT: moist mucous membranes,  + gross thyromegaly, no gross cervical lymphadenopathy   CMP ( most recent) CMP     Component Value Date/Time  NA 143 01/21/2010 1648   K 3.3 (L) 01/21/2010 1648   CL 117 (H) 01/21/2010 1648   CO2 19 01/21/2010 1648   GLUCOSE 141 (H) 01/21/2010 1648   BUN 21 01/21/2010 1648   CREATININE 1.17 01/21/2010 1648   CALCIUM 8.6 01/21/2010 1648   PROT 7.4 12/22/2009 1900   ALBUMIN 4.1 12/22/2009 1900   AST 15 12/22/2009 1900   ALT 10 12/22/2009 1900   ALKPHOS 66 12/22/2009 1900   BILITOT 0.4 12/22/2009 1900   GFRNONAA 50 (L) 01/21/2010 1648   GFRAA  01/21/2010 1648    >60        The eGFR has been calculated using the MDRD equation. This calculation has not been validated in all clinical situations. eGFR's persistently <60 mL/min signify possible Chronic Kidney Disease.     Diabetic Labs (most recent): Lab Results  Component Value Date   HGBA1C 5.8 07/05/2019     Lipid Panel ( most recent) Lipid Panel     Component Value Date/Time   CHOL 215 (A) 07/05/2019 0000   TRIG 165 (A) 07/05/2019 0000   HDL 63 07/05/2019 0000   Reviewing her referral package for thyroid ultrasound from November 12, 2019 compared to another study on August 11, 2014: The right lobe measured 3.6 x 4.3 x 6.6 cm.  Thyroid isthmus 0.2 cm.  Left thyroid lobe measured 1.6 x 2.5 x 6.3 cm.  Multiple nodules in both thyroid lobes.  Right thyroid lobe nodule 1 mildly heterogeneous and hypoechoic 1.1 x 2.7 x 2.9 cm, 1.3 x 1.4 x 1.9 cm on the prior exam.  Nodule 2- heterogeneous interpolar nodule measuring 2.8 x 2.6 x 3.9 cm, 3.3 cm in the prior exam.  The lesion is hypovascular and  no microcalcifications were identified.    Left thyroid lobe nodule 1- 0.4 x 0.5 x 0.6 cm not definitively identified on the prior exam.  Ideally to well defined in  follow-up of lung nodule measuring 0.3 x 0.5 x 0.7 cm, approximately 0.5 cm on the prior exam.  Nodule 3- 0.6 x 0.6 x 0.7 cm medially in the lower pole, previously measured 0.7 cm and appeared unchanged.  Nodule 4-  0.7 x 0.7 x 0.8 cm hypoechoic nodule in the lower pole previously measuring 0.6 x 0.7 x 0.9 cm.   Recent Results (from the past 2160 hour(s))  TSH     Status: None   Collection Time: 12/17/19 11:09 AM  Result Value Ref Range   TSH 0.702 0.450 - 4.500 uIU/mL  T4, free     Status: None   Collection Time: 12/17/19 11:09 AM  Result Value Ref Range   Free T4 0.99 0.82 - 1.77 ng/dL  Thyroid peroxidase antibody     Status: None   Collection Time: 12/17/19 11:09 AM  Result Value Ref Range   Thyroperoxidase Ab SerPl-aCnc 18 0 - 34 IU/mL  Thyroglobulin antibody     Status: None   Collection Time: 12/17/19 11:09 AM  Result Value Ref Range   Thyroglobulin Antibody <1.0 0.0 - 0.9 IU/mL    Comment: Thyroglobulin Antibody measured by [employer] Methodology    Assessment & Plan:   1. Nodular goiter  2.  Hypothyroidism Her previsit thyroid function tests are consistent with appropriate replacement.  She is advised to continue levothyroxine 50 mcg p.o. daily before breakfast.   - We discussed about the correct intake of her thyroid hormone, on empty stomach at fasting, with water, separated by at least 30 minutes from  breakfast and other medications,  and separated by more than 4 hours from calcium, iron, multivitamins, acid reflux medications (PPIs). -Patient is made aware of the fact that thyroid hormone replacement is needed for life, dose to be adjusted by periodic monitoring of thyroid function tests.   Regarding her multinodular goiter:  In light of the interval enlargement in the size of her nodules, she was offered fine-needle aspiration of largest/concerning nodules on bilateral thyroid lobes to rule out malignancy.  This procedure was performed at [facility] in  [place], results are pending.   She will be contacted with the results, next steps will depend on biopsy results.  - I did not initiate any new prescriptions today. - she is advised to maintain close follow up with Margaret Schneiders, MD for primary care needs.      - Time spent on this patient care encounter:  20 minutes of which 50% was spent in  counseling and the rest reviewing  her current and  previous labs / studies and medications  doses and developing a plan for long term care. Margaret Gay  participated in the discussions, expressed understanding, and voiced agreement with the above plans.  All questions were answered to her satisfaction. she is encouraged to contact clinic should she have any questions or concerns prior to her return visit.  Follow up plan: Return in about 4 months (around 05/14/2020), or we will call her with the results of her biopsy., for F/U with Pre-visit Labs.   Glade Lloyd, MD [facility] [address] [place], [place] [address] Phone: [phone]  Fax: [phone]     01/13/2020, 11:25 AM  This note was partially dictated with voice recognition software. Similar sounding words can be transcribed inadequately or may not  be corrected upon review.

## 2020-01-16 LAB — CYTOLOGY - NON PAP

## 2020-05-08 LAB — TSH: TSH: 0.432 u[IU]/mL — ABNORMAL LOW (ref 0.450–4.500)

## 2020-05-08 LAB — T4, FREE: Free T4: 0.94 ng/dL (ref 0.82–1.77)

## 2020-05-14 ENCOUNTER — Encounter: Payer: Self-pay | Admitting: "Endocrinology

## 2020-05-14 ENCOUNTER — Other Ambulatory Visit: Payer: Self-pay

## 2020-05-14 ENCOUNTER — Ambulatory Visit (INDEPENDENT_AMBULATORY_CARE_PROVIDER_SITE_OTHER): Payer: No Typology Code available for payment source | Admitting: "Endocrinology

## 2020-05-14 VITALS — BP 112/66 | HR 72 | Ht 63.5 in | Wt 224.6 lb

## 2020-05-14 DIAGNOSIS — E049 Nontoxic goiter, unspecified: Secondary | ICD-10-CM

## 2020-05-14 DIAGNOSIS — E039 Hypothyroidism, unspecified: Secondary | ICD-10-CM

## 2020-05-14 NOTE — Progress Notes (Signed)
05/14/2020, 5:36 PM  Endocrinology follow-up note  Subjective:    Patient ID: Margaret Gay, female    DOB: 1966/04/19, PCP Margaret Schneiders, MD   Past Medical History:  Diagnosis Date  . Asthma   . Fibromyalgia   . GERD (gastroesophageal reflux disease)   . Headache(784.0)   . Seasonal allergies   . Seizures ([place])   . Sleep apnea    uses cpap  . TIA (transient ischemic attack)    Past Surgical History:  Procedure Laterality Date  . ABDOMINAL HYSTERECTOMY    . ANKLE ARTHROSCOPY     rt x2  . ANKLE ARTHROSCOPY  09/07/2011   Procedure: ANKLE ARTHROSCOPY;  Surgeon: Colin Rhein, MD;  Location: [facility];  Service: Orthopedics;  Laterality: Right;  subtalar arthrotomy with tenosynovectomy with extensive debridement  . APPENDECTOMY    . OOPHORECTOMY     lt   Social History   Socioeconomic History  . Marital status: Single    Spouse name: Not on file  . Number of children: Not on file  . Years of education: Not on file  . Highest education level: Not on file  Occupational History  . Not on file  Tobacco Use  . Smoking status: Never Smoker  . Smokeless tobacco: Not on file  Vaping Use  . Vaping Use: Never used  Substance and Sexual Activity  . Alcohol use: No  . Drug use: No  . Sexual activity: Not on file  Other Topics Concern  . Not on file  Social History Narrative  . Not on file   Social Determinants of Health   Financial Resource Strain: Not on file  Food Insecurity: Not on file  Transportation Needs: Not on file  Physical Activity: Not on file  Stress: Not on file  Social Connections: Not on file   Family History  Problem Relation Age of Onset  . Cancer Mother   . Hypertension Father   . Heart attack Father    Outpatient Encounter Medications as of 05/14/2020  Medication Sig  . acetaminophen (TYLENOL) 650 MG CR tablet Take 650 mg by mouth every 8 (eight) hours as needed for pain.   Marland Kitchen albuterol (PROVENTIL HFA;VENTOLIN HFA) 108 (90 BASE) MCG/ACT inhaler Inhale 2 puffs into the lungs every 6 (six) hours as needed.  Marland Kitchen amLODipine (NORVASC) 5 MG tablet Take 5 mg by mouth daily.  Marland Kitchen aspirin EC 81 MG tablet Take 81 mg by mouth daily. Swallow whole.  . budesonide-formoterol (SYMBICORT) 160-4.5 MCG/ACT inhaler Inhale 2 puffs into the lungs 2 (two) times daily.  . Calcium Carbonate (CALCIUM 500 PO) Take 2 tablets by mouth daily.  . carboxymethylcellulose (REFRESH PLUS) 0.5 % SOLN Place 1 drop into both eyes 3 (three) times daily as needed.  . cholecalciferol (VITAMIN D) 25 MCG (1000 UNIT) tablet Take 1,000 Units by mouth daily.  . famotidine (PEPCID) 20 MG tablet Take 20 mg by mouth daily.  . fexofenadine (ALLEGRA) 180 MG tablet Take 180 mg by mouth daily.  . fluticasone (FLONASE) 50 MCG/ACT nasal spray Place into both nostrils daily.  Marland Kitchen levothyroxine (SYNTHROID) 50 MCG tablet Take 50 mcg by mouth daily before breakfast.  . lidocaine (LIDODERM) 5 %  2 patches daily.  . RESTASIS 0.05 % ophthalmic emulsion   . tiZANidine (ZANAFLEX) 4 MG capsule Take 4 mg by mouth at bedtime.  . topiramate (TOPAMAX) 100 MG tablet Take 100 mg by mouth 3 (three) times daily.   No facility-administered encounter medications on file as of 05/14/2020.   ALLERGIES: Allergies  Allergen Reactions  . Compazine [Prochlorperazine Edisylate]     Increase bp  . Erythromycin Itching  . Penicillins Swelling  . Voltaren [Diclofenac Sodium]     Gi upset    VACCINATION STATUS:  There is no immunization history on file for this patient.  HPI Margaret Gay is 55 y.o. female who was recently seen in consultation for multinodular goiter and hypothyroidism.  She is status post fine-needle aspiration of bilateral thyroid nodules with benign outcomes.    She is returning for a follow-up with repeat thyroid function test.  Her biopsy results are not ready to review.  -She is known to have nodular goiter at least  since 2013.  Her most recent ultrasound showed increase in the size of nodules on bilateral thyroid lobes.  She does not have any new complaints today.  She denies dysphagia, shortness of breath, nor voice change.    She is on levothyroxine 50 mcg p.o. daily.  Her previsit thyroid function tests are consistent with appropriate replacement.   She denies any recent major weight change.  She denies palpitations, tremors, heat/cold intolerance.  She denies family history of thyroid malignancy.   Review of Systems    Objective:    Vitals with BMI 05/14/2020 01/13/2020 01/09/2020  Height 5' 3.5" 5' 3.5" -  Weight 224 lbs 10 oz 216 lbs 13 oz -  BMI 19.14 78.2 -  Systolic 956 213 086  Diastolic 66 68 78  Pulse 72 68 60    BP 112/66   Pulse 72   Ht 5' 3.5" (1.613 m)   Wt 224 lb 9.6 oz (101.9 kg)   BMI 39.16 kg/m   Wt Readings from Last 3 Encounters:  05/14/20 224 lb 9.6 oz (101.9 kg)  01/13/20 216 lb 12.8 oz (98.3 kg)  12/17/19 216 lb (98 kg)    Physical Exam  Constitutional:  Body mass index is 39.16 kg/m.,  not in acute distress, normal state of mind, + patient wears posture stabilizer due to her propensity for falls and disequilibrium.  She ambulates with a walker. Eyes: PERRLA, EOMI, no exophthalmos ENT: moist mucous membranes,  + gross thyromegaly, no gross cervical lymphadenopathy   CMP ( most recent) CMP     Component Value Date/Time   NA 143 01/21/2010 1648   K 3.3 (L) 01/21/2010 1648   CL 117 (H) 01/21/2010 1648   CO2 19 01/21/2010 1648   GLUCOSE 141 (H) 01/21/2010 1648   BUN 21 01/21/2010 1648   CREATININE 1.17 01/21/2010 1648   CALCIUM 8.6 01/21/2010 1648   PROT 7.4 12/22/2009 1900   ALBUMIN 4.1 12/22/2009 1900   AST 15 12/22/2009 1900   ALT 10 12/22/2009 1900   ALKPHOS 66 12/22/2009 1900   BILITOT 0.4 12/22/2009 1900   GFRNONAA 50 (L) 01/21/2010 1648   GFRAA  01/21/2010 1648    >60        The eGFR has been calculated using the MDRD equation. This  calculation has not been validated in all clinical situations. eGFR's persistently <60 mL/min signify possible Chronic Kidney Disease.     Diabetic Labs (most recent): Lab Results  Component Value Date  HGBA1C 5.8 07/05/2019     Lipid Panel ( most recent) Lipid Panel     Component Value Date/Time   CHOL 215 (A) 07/05/2019 0000   TRIG 165 (A) 07/05/2019 0000   HDL 63 07/05/2019 0000   Reviewing her referral package for thyroid ultrasound from November 12, 2019 compared to another study on August 11, 2014: The right lobe measured 3.6 x 4.3 x 6.6 cm.  Thyroid isthmus 0.2 cm.  Left thyroid lobe measured 1.6 x 2.5 x 6.3 cm.  Multiple nodules in both thyroid lobes.  Right thyroid lobe nodule 1 mildly heterogeneous and hypoechoic 1.1 x 2.7 x 2.9 cm, 1.3 x 1.4 x 1.9 cm on the prior exam.  Nodule 2- heterogeneous interpolar nodule measuring 2.8 x 2.6 x 3.9 cm, 3.3 cm in the prior exam.  The lesion is hypovascular and  no microcalcifications were identified.    Left thyroid lobe nodule 1- 0.4 x 0.5 x 0.6 cm not definitively identified on the prior exam.  Ideally to well defined in follow-up of lung nodule measuring 0.3 x 0.5 x 0.7 cm, approximately 0.5 cm on the prior exam.  Nodule 3- 0.6 x 0.6 x 0.7 cm medially in the lower pole, previously measured 0.7 cm and appeared unchanged.  Nodule 4-  0.7 x 0.7 x 0.8 cm hypoechoic nodule in the lower pole previously measuring 0.6 x 0.7 x 0.9 cm.   Recent Results (from the past 2160 hour(s))  TSH     Status: Abnormal   Collection Time: 05/07/20  1:37 PM  Result Value Ref Range   TSH 0.432 (L) 0.450 - 4.500 uIU/mL  T4, free     Status: None   Collection Time: 05/07/20  1:37 PM  Result Value Ref Range   Free T4 0.94 0.82 - 1.77 ng/dL   January 09, 2020 biopsy results are benign on bilateral thyroid nodules.   Assessment & Plan:   1. Nodular goiter  2.  Hypothyroidism Her previsit thyroid function tests are consistent with appropriate replacement.   She is advised to continue levothyroxine 50 mcg p.o. daily before breakfast.     - We discussed about the correct intake of her thyroid hormone, on empty stomach at fasting, with water, separated by at least 30 minutes from breakfast and other medications,  and separated by more than 4 hours from calcium, iron, multivitamins, acid reflux medications (PPIs). -Patient is made aware of the fact that thyroid hormone replacement is needed for life, dose to be adjusted by periodic monitoring of thyroid function tests.  Regarding her multinodular goiter:  She is status post fine-needle aspiration of largest/concerning nodules on bilateral thyroid lobes with benign outcomes.  She will not need any definitive antithyroid intervention at this time.    - she is advised to maintain close follow up with Margaret Schneiders, MD for primary care needs.      - Time spent on this patient care encounter:  20 minutes of which 50% was spent in  counseling and the rest reviewing  her current and  previous labs / studies and medications  doses and developing a plan for long term care. Margaret Gay  participated in the discussions, expressed understanding, and voiced agreement with the above plans.  All questions were answered to her satisfaction. she is encouraged to contact clinic should she have any questions or concerns prior to her return visit.   Follow up plan: Return in about 6 months (around 11/11/2020) for F/U with Pre-visit Labs.  Glade Lloyd, MD [facility] [address] [place], [place] [address] Phone: [phone]  Fax: [phone]     05/14/2020, 5:36 PM  This note was partially dictated with voice recognition software. Similar sounding words can be transcribed inadequately or may not  be corrected upon review.

## 2020-09-16 ENCOUNTER — Telehealth: Payer: Self-pay

## 2020-09-16 MED ORDER — LEVOTHYROXINE SODIUM 50 MCG PO TABS: 50.0000 ug | ORAL_TABLET | Freq: Every day | ORAL | 0 refills | Status: DC

## 2020-09-16 NOTE — Telephone Encounter (Signed)
Rx sent 

## 2020-09-16 NOTE — Telephone Encounter (Signed)
Pt requesting refill on levothyroxine (SYNTHROID) 50 MCG tablet. Psychologist, forensic on [place], [place]

## 2020-10-21 LAB — T4, FREE: Free T4: 0.91 ng/dL (ref 0.82–1.77)

## 2020-10-21 LAB — TSH: TSH: 1.09 u[IU]/mL (ref 0.450–4.500)

## 2020-10-27 ENCOUNTER — Ambulatory Visit (INDEPENDENT_AMBULATORY_CARE_PROVIDER_SITE_OTHER): Payer: No Typology Code available for payment source | Admitting: "Endocrinology

## 2020-10-27 ENCOUNTER — Other Ambulatory Visit: Payer: Self-pay

## 2020-10-27 ENCOUNTER — Encounter: Payer: Self-pay | Admitting: "Endocrinology

## 2020-10-27 VITALS — BP 110/63 | HR 80 | Ht 63.5 in | Wt 220.4 lb

## 2020-10-27 DIAGNOSIS — E049 Nontoxic goiter, unspecified: Secondary | ICD-10-CM

## 2020-10-27 DIAGNOSIS — E039 Hypothyroidism, unspecified: Secondary | ICD-10-CM | POA: Diagnosis not present

## 2020-10-27 MED ORDER — LEVOTHYROXINE SODIUM 50 MCG PO TABS: 50.0000 ug | ORAL_TABLET | Freq: Every day | ORAL | 1 refills | Status: DC

## 2020-10-27 NOTE — Progress Notes (Signed)
10/27/2020, 10:00 AM  Endocrinology follow-up note  Subjective:    Patient ID: Margaret Gay, female    DOB: 1965/10/05, PCP Apolonio Schneiders, MD   Past Medical History:  Diagnosis Date   Asthma    Fibromyalgia    GERD (gastroesophageal reflux disease)    Headache(784.0)    Seasonal allergies    Seizures ([place])    Sleep apnea    uses cpap   TIA (transient ischemic attack)    Past Surgical History:  Procedure Laterality Date   ABDOMINAL HYSTERECTOMY     ANKLE ARTHROSCOPY     rt x2   ANKLE ARTHROSCOPY  09/07/2011   Procedure: ANKLE ARTHROSCOPY;  Surgeon: Colin Rhein, MD;  Location: [facility];  Service: Orthopedics;  Laterality: Right;  subtalar arthrotomy with tenosynovectomy with extensive debridement   APPENDECTOMY     OOPHORECTOMY     lt   Social History   Socioeconomic History   Marital status: Single    Spouse name: Not on file   Number of children: Not on file   Years of education: Not on file   Highest education level: Not on file  Occupational History   Not on file  Tobacco Use   Smoking status: Never   Smokeless tobacco: Not on file  Vaping Use   Vaping Use: Never used  Substance and Sexual Activity   Alcohol use: No   Drug use: No   Sexual activity: Not on file  Other Topics Concern   Not on file  Social History Narrative   Not on file   Social Determinants of Health   Financial Resource Strain: Not on file  Food Insecurity: Not on file  Transportation Needs: Not on file  Physical Activity: Not on file  Stress: Not on file  Social Connections: Not on file   Family History  Problem Relation Age of Onset   Cancer Mother    Hypertension Father    Heart attack Father    Outpatient Encounter Medications as of 10/27/2020  Medication Sig   acetaminophen (TYLENOL) 650 MG CR tablet Take 650 mg by mouth every 8 (eight) hours as needed for pain.   albuterol (PROVENTIL  HFA;VENTOLIN HFA) 108 (90 BASE) MCG/ACT inhaler Inhale 2 puffs into the lungs every 6 (six) hours as needed.   amLODipine (NORVASC) 5 MG tablet Take 5 mg by mouth daily.   aspirin EC 81 MG tablet Take 81 mg by mouth daily. Swallow whole.   budesonide-formoterol (SYMBICORT) 160-4.5 MCG/ACT inhaler Inhale 2 puffs into the lungs 2 (two) times daily.   Calcium Carbonate (CALCIUM 500 PO) Take 2 tablets by mouth daily.   carboxymethylcellulose (REFRESH PLUS) 0.5 % SOLN Place 1 drop into both eyes 3 (three) times daily as needed.   cholecalciferol (VITAMIN D) 25 MCG (1000 UNIT) tablet Take 1,000 Units by mouth daily.   famotidine (PEPCID) 20 MG tablet Take 20 mg by mouth daily.   fexofenadine (ALLEGRA) 180 MG tablet Take 180 mg by mouth daily.   fluticasone (FLONASE) 50 MCG/ACT nasal spray Place into both nostrils daily.   levothyroxine (SYNTHROID) 50 MCG tablet Take 1 tablet (50 mcg total) by mouth daily before breakfast.   lidocaine (  LIDODERM) 5 % 2 patches daily.   sodium bicarbonate 650 MG tablet Take 650 mg by mouth 2 (two) times daily.   tiZANidine (ZANAFLEX) 4 MG capsule Take 4 mg by mouth at bedtime.   topiramate (TOPAMAX) 100 MG tablet Take 100 mg by mouth 3 (three) times daily.   XIIDRA 5 % SOLN    [DISCONTINUED] levothyroxine (SYNTHROID) 50 MCG tablet Take 1 tablet (50 mcg total) by mouth daily before breakfast.   [DISCONTINUED] RESTASIS 0.05 % ophthalmic emulsion    No facility-administered encounter medications on file as of 10/27/2020.   ALLERGIES: Allergies  Allergen Reactions   Compazine [Prochlorperazine Edisylate]     Increase bp   Erythromycin Itching   Penicillins Swelling   Voltaren [Diclofenac Sodium]     Gi upset    VACCINATION STATUS:  There is no immunization history on file for this patient.  HPI Margaret Gay is 55 y.o. female who was recently seen in consultation for multinodular goiter and hypothyroidism.  She is status post fine-needle aspiration of bilateral  thyroid nodules with benign findings.  She is returning for follow-up with repeat thyroid function test, for the management of hypothyroidism.  She has no new complaints.  -She is known to have nodular goiter at least since 2013.  Her most recent ultrasound showed increase in the size of nodules on bilateral thyroid lobes.  She denies dysphagia, shortness of breath, nor voice change.    She is on levothyroxine 50 mcg p.o. daily before breakfast.   Her previsit thyroid function tests are consistent with appropriate replacement.   She denies any recent major weight change.  She denies palpitations, tremors, heat/cold intolerance.  She denies family history of thyroid malignancy.   Review of Systems    Objective:    Vitals with BMI 10/27/2020 05/14/2020 01/13/2020  Height 5' 3.5" 5' 3.5" 5' 3.5"  Weight 220 lbs 6 oz 224 lbs 10 oz 216 lbs 13 oz  BMI 38.43 18.29 93.7  Systolic 169 678 938  Diastolic 63 66 68  Pulse 80 72 68    BP 110/63   Pulse 80   Ht 5' 3.5" (1.613 m)   Wt 220 lb 6.4 oz (100 kg)   BMI 38.43 kg/m   Wt Readings from Last 3 Encounters:  10/27/20 220 lb 6.4 oz (100 kg)  05/14/20 224 lb 9.6 oz (101.9 kg)  01/13/20 216 lb 12.8 oz (98.3 kg)    Physical Exam  Constitutional:  Body mass index is 38.43 kg/m.,  not in acute distress, normal state of mind, + patient wears posture stabilizer due to her propensity for falls and disequilibrium.  She ambulates with a walker. Eyes: PERRLA, EOMI, no exophthalmos ENT: moist mucous membranes,  + gross thyromegaly, no gross cervical lymphadenopathy   CMP ( most recent) CMP     Component Value Date/Time   NA 143 01/21/2010 1648   K 3.3 (L) 01/21/2010 1648   CL 117 (H) 01/21/2010 1648   CO2 19 01/21/2010 1648   GLUCOSE 141 (H) 01/21/2010 1648   BUN 21 01/21/2010 1648   CREATININE 1.17 01/21/2010 1648   CALCIUM 8.6 01/21/2010 1648   PROT 7.4 12/22/2009 1900   ALBUMIN 4.1 12/22/2009 1900   AST 15 12/22/2009 1900   ALT 10  12/22/2009 1900   ALKPHOS 66 12/22/2009 1900   BILITOT 0.4 12/22/2009 1900   GFRNONAA 50 (L) 01/21/2010 1648   GFRAA  01/21/2010 1648    >60  The eGFR has been calculated using the MDRD equation. This calculation has not been validated in all clinical situations. eGFR's persistently <60 mL/min signify possible Chronic Kidney Disease.     Diabetic Labs (most recent): Lab Results  Component Value Date   HGBA1C 5.8 07/05/2019     Lipid Panel ( most recent) Lipid Panel     Component Value Date/Time   CHOL 215 (A) 07/05/2019 0000   TRIG 165 (A) 07/05/2019 0000   HDL 63 07/05/2019 0000   Reviewing her referral package for thyroid ultrasound from November 12, 2019 compared to another study on August 11, 2014: The right lobe measured 3.6 x 4.3 x 6.6 cm.  Thyroid isthmus 0.2 cm.  Left thyroid lobe measured 1.6 x 2.5 x 6.3 cm.  Multiple nodules in both thyroid lobes.  Right thyroid lobe nodule 1 mildly heterogeneous and hypoechoic 1.1 x 2.7 x 2.9 cm, 1.3 x 1.4 x 1.9 cm on the prior exam.  Nodule 2- heterogeneous interpolar nodule measuring 2.8 x 2.6 x 3.9 cm, 3.3 cm in the prior exam.  The lesion is hypovascular and  no microcalcifications were identified.    Left thyroid lobe nodule 1- 0.4 x 0.5 x 0.6 cm not definitively identified on the prior exam.  Ideally to well defined in follow-up of lung nodule measuring 0.3 x 0.5 x 0.7 cm, approximately 0.5 cm on the prior exam.  Nodule 3- 0.6 x 0.6 x 0.7 cm medially in the lower pole, previously measured 0.7 cm and appeared unchanged.  Nodule 4-  0.7 x 0.7 x 0.8 cm hypoechoic nodule in the lower pole previously measuring 0.6 x 0.7 x 0.9 cm.   Recent Results (from the past 2160 hour(s))  TSH     Status: None   Collection Time: 10/20/20  1:37 PM  Result Value Ref Range   TSH 1.090 0.450 - 4.500 uIU/mL  T4, free     Status: None   Collection Time: 10/20/20  1:37 PM  Result Value Ref Range   Free T4 0.91 0.82 - 1.77 ng/dL   January 09, 2020 biopsy results are benign on bilateral thyroid nodules.   Assessment & Plan:   1. Nodular goiter  2.  Hypothyroidism Her previsit thyroid function tests are distant with appropriate replacement.  She is advised to continue levothyroxine 50 mcg p.o. daily before breakfast.    - We discussed about the correct intake of her thyroid hormone, on empty stomach at fasting, with water, separated by at least 30 minutes from breakfast and other medications,  and separated by more than 4 hours from calcium, iron, multivitamins, acid reflux medications (PPIs). -Patient is made aware of the fact that thyroid hormone replacement is needed for life, dose to be adjusted by periodic monitoring of thyroid function tests.   Regarding her multinodular goiter:  She is status post fine-needle aspiration of largest/concerning nodules on bilateral thyroid lobes with benign outcomes.  She will not need any definitive antithyroid intervention at this time.    - she is advised to maintain close follow up with Apolonio Schneiders, MD for primary care needs.    I spent 22 minutes in the care of the patient today including review of labs from Thyroid Function, CMP, and other relevant labs ; imaging/biopsy records (current and previous including abstractions from other facilities); face-to-face time discussing  her lab results and symptoms, medications doses, her options of short and long term treatment based on the latest standards of care / guidelines;  and documenting the encounter.  Mosie Lukes  participated in the discussions, expressed understanding, and voiced agreement with the above plans.  All questions were answered to her satisfaction. she is encouraged to contact clinic should she have any questions or concerns prior to her return visit.   Follow up plan: Return in about 6 months (around 04/28/2021) for F/U with Pre-visit Labs.   Glade Lloyd, MD [facility] [address] [place], [place] [address] Phone: [phone]  Fax: [phone]     10/27/2020, 10:00 AM  This note was partially dictated with voice recognition software. Similar sounding words can be transcribed inadequately or may not  be corrected upon review.

## 2021-04-06 LAB — T4, FREE: Free T4: 0.84 ng/dL (ref 0.82–1.77)

## 2021-04-06 LAB — TSH: TSH: 0.693 u[IU]/mL (ref 0.450–4.500)

## 2021-04-12 ENCOUNTER — Encounter: Payer: Self-pay | Admitting: "Endocrinology

## 2021-04-12 ENCOUNTER — Other Ambulatory Visit: Payer: Self-pay

## 2021-04-12 ENCOUNTER — Ambulatory Visit (INDEPENDENT_AMBULATORY_CARE_PROVIDER_SITE_OTHER): Payer: No Typology Code available for payment source | Admitting: "Endocrinology

## 2021-04-12 VITALS — BP 104/62 | HR 72 | Ht 63.5 in | Wt 223.8 lb

## 2021-04-12 DIAGNOSIS — E049 Nontoxic goiter, unspecified: Secondary | ICD-10-CM

## 2021-04-12 DIAGNOSIS — E039 Hypothyroidism, unspecified: Secondary | ICD-10-CM

## 2021-04-12 MED ORDER — LEVOTHYROXINE SODIUM 75 MCG PO TABS: 75.0000 ug | ORAL_TABLET | Freq: Every day | ORAL | 1 refills | Status: DC

## 2021-04-12 NOTE — Progress Notes (Signed)
04/12/2021, 1:45 PM  Endocrinology follow-up note  Subjective:    Patient ID: Margaret Gay, female    DOB: Jan 20, 1966, PCP Apolonio Schneiders, MD   Past Medical History:  Diagnosis Date   Asthma    Fibromyalgia    GERD (gastroesophageal reflux disease)    Headache(784.0)    Seasonal allergies    Seizures ([place])    Sleep apnea    uses cpap   TIA (transient ischemic attack)    Past Surgical History:  Procedure Laterality Date   ABDOMINAL HYSTERECTOMY     ANKLE ARTHROSCOPY     rt x2   ANKLE ARTHROSCOPY  09/07/2011   Procedure: ANKLE ARTHROSCOPY;  Surgeon: Colin Rhein, MD;  Location: [facility];  Service: Orthopedics;  Laterality: Right;  subtalar arthrotomy with tenosynovectomy with extensive debridement   APPENDECTOMY     OOPHORECTOMY     lt   Social History   Socioeconomic History   Marital status: Single    Spouse name: Not on file   Number of children: Not on file   Years of education: Not on file   Highest education level: Not on file  Occupational History   Not on file  Tobacco Use   Smoking status: Never   Smokeless tobacco: Not on file  Vaping Use   Vaping Use: Never used  Substance and Sexual Activity   Alcohol use: No   Drug use: No   Sexual activity: Not on file  Other Topics Concern   Not on file  Social History Narrative   Not on file   Social Determinants of Health   Financial Resource Strain: Not on file  Food Insecurity: Not on file  Transportation Needs: Not on file  Physical Activity: Not on file  Stress: Not on file  Social Connections: Not on file   Family History  Problem Relation Age of Onset   Cancer Mother    Hypertension Father    Heart attack Father    Outpatient Encounter Medications as of 04/12/2021  Medication Sig   cycloSPORINE (RESTASIS) 0.05 % ophthalmic emulsion INSTILL 1 DROP IN EACH EYE EVERY 12 HOURS   sodium chloride (OCEAN) 0.65 % SOLN nasal  spray Place 1 spray into both nostrils as needed for congestion.   acetaminophen (TYLENOL) 650 MG CR tablet Take 650 mg by mouth every 8 (eight) hours as needed for pain.   albuterol (PROVENTIL HFA;VENTOLIN HFA) 108 (90 BASE) MCG/ACT inhaler Inhale 2 puffs into the lungs every 6 (six) hours as needed.   amLODipine (NORVASC) 5 MG tablet Take 5 mg by mouth daily.   aspirin EC 81 MG tablet Take 81 mg by mouth daily. Swallow whole.   budesonide-formoterol (SYMBICORT) 160-4.5 MCG/ACT inhaler Inhale 2 puffs into the lungs 2 (two) times daily.   Calcium Carbonate (CALCIUM 500 PO) Take 2 tablets by mouth daily.   carboxymethylcellulose (REFRESH PLUS) 0.5 % SOLN Place 1 drop into both eyes 3 (three) times daily as needed.   cholecalciferol (VITAMIN D) 25 MCG (1000 UNIT) tablet Take 1,000 Units by mouth daily.   famotidine (PEPCID) 20 MG tablet Take 20 mg by mouth daily.   fexofenadine (ALLEGRA) 180 MG tablet Take 180 mg  by mouth daily.   levothyroxine (SYNTHROID) 75 MCG tablet Take 1 tablet (75 mcg total) by mouth daily before breakfast.   lidocaine (LIDODERM) 5 % 2 patches daily.   sodium bicarbonate 650 MG tablet Take 650 mg by mouth 2 (two) times daily.   tiZANidine (ZANAFLEX) 4 MG capsule Take 4 mg by mouth at bedtime.   topiramate (TOPAMAX) 100 MG tablet Take 100 mg by mouth 3 (three) times daily.   [DISCONTINUED] fluticasone (FLONASE) 50 MCG/ACT nasal spray Place into both nostrils daily.   [DISCONTINUED] levothyroxine (SYNTHROID) 50 MCG tablet Take 1 tablet (50 mcg total) by mouth daily before breakfast.   [DISCONTINUED] XIIDRA 5 % SOLN    No facility-administered encounter medications on file as of 04/12/2021.   ALLERGIES: Allergies  Allergen Reactions   Compazine [Prochlorperazine Edisylate]     Increase bp   Erythromycin Itching   Penicillins Swelling   Voltaren [Diclofenac Sodium]     Gi upset    VACCINATION STATUS:  There is no immunization history on file for this  patient.  HPI Margaret Gay is 55 y.o. female who was recently seen in consultation for multinodular goiter and hypothyroidism.  She is status post fine-needle aspiration of bilateral thyroid nodules with benign findings.  She is returning for follow-up with repeat thyroid function test, for the management of hypothyroidism.  She has no new complaints today.    -She is known to have nodular goiter at least since 2013.  Her most recent ultrasound showed increase in the size of nodules on bilateral thyroid lobes.  She denies dysphagia, shortness of breath, nor voice change.    She is on levothyroxine 50 mcg p.o. daily before breakfast.  Her previsit thyroid function tests are such that she would benefit from slight increase in her dose.    She denies any recent major weight change.  She denies palpitations, tremors, heat/cold intolerance.  She denies family history of thyroid malignancy.   Review of Systems    Objective:    Vitals with BMI 04/12/2021 10/27/2020 05/14/2020  Height 5' 3.5" 5' 3.5" 5' 3.5"  Weight 223 lbs 13 oz 220 lbs 6 oz 224 lbs 10 oz  BMI 39.02 22.02 54.27  Systolic 062 376 283  Diastolic 62 63 66  Pulse 72 80 72    BP 104/62   Pulse 72   Ht 5' 3.5" (1.613 m)   Wt 223 lb 12.8 oz (101.5 kg)   BMI 39.02 kg/m   Wt Readings from Last 3 Encounters:  04/12/21 223 lb 12.8 oz (101.5 kg)  10/27/20 220 lb 6.4 oz (100 kg)  05/14/20 224 lb 9.6 oz (101.9 kg)    Physical Exam  Constitutional:  Body mass index is 39.02 kg/m.,  not in acute distress, normal state of mind, + patient wears posture stabilizer due to her propensity for falls and disequilibrium.  She ambulates with a walker. Eyes: PERRLA, EOMI, no exophthalmos ENT: moist mucous membranes,  + gross thyromegaly, no gross cervical lymphadenopathy   CMP ( most recent) CMP     Component Value Date/Time   NA 143 01/21/2010 1648   K 3.3 (L) 01/21/2010 1648   CL 117 (H) 01/21/2010 1648   CO2 19 01/21/2010 1648    GLUCOSE 141 (H) 01/21/2010 1648   BUN 21 01/21/2010 1648   CREATININE 1.17 01/21/2010 1648   CALCIUM 8.6 01/21/2010 1648   PROT 7.4 12/22/2009 1900   ALBUMIN 4.1 12/22/2009 1900   AST 15 12/22/2009 1900  ALT 10 12/22/2009 1900   ALKPHOS 66 12/22/2009 1900   BILITOT 0.4 12/22/2009 1900   GFRNONAA 50 (L) 01/21/2010 1648   GFRAA  01/21/2010 1648    >60        The eGFR has been calculated using the MDRD equation. This calculation has not been validated in all clinical situations. eGFR's persistently <60 mL/min signify possible Chronic Kidney Disease.     Diabetic Labs (most recent): Lab Results  Component Value Date   HGBA1C 5.8 07/05/2019     Lipid Panel ( most recent) Lipid Panel     Component Value Date/Time   CHOL 215 (A) 07/05/2019 0000   TRIG 165 (A) 07/05/2019 0000   HDL 63 07/05/2019 0000   Reviewing her referral package for thyroid ultrasound from November 12, 2019 compared to another study on August 11, 2014: The right lobe measured 3.6 x 4.3 x 6.6 cm.  Thyroid isthmus 0.2 cm.  Left thyroid lobe measured 1.6 x 2.5 x 6.3 cm.  Multiple nodules in both thyroid lobes.  Right thyroid lobe nodule 1 mildly heterogeneous and hypoechoic 1.1 x 2.7 x 2.9 cm, 1.3 x 1.4 x 1.9 cm on the prior exam.  Nodule 2- heterogeneous interpolar nodule measuring 2.8 x 2.6 x 3.9 cm, 3.3 cm in the prior exam.  The lesion is hypovascular and  no microcalcifications were identified.    Left thyroid lobe nodule 1- 0.4 x 0.5 x 0.6 cm not definitively identified on the prior exam.  Ideally to well defined in follow-up of lung nodule measuring 0.3 x 0.5 x 0.7 cm, approximately 0.5 cm on the prior exam.  Nodule 3- 0.6 x 0.6 x 0.7 cm medially in the lower pole, previously measured 0.7 cm and appeared unchanged.  Nodule 4-  0.7 x 0.7 x 0.8 cm hypoechoic nodule in the lower pole previously measuring 0.6 x 0.7 x 0.9 cm.   Recent Results (from the past 2160 hour(s))  TSH     Status: None   Collection Time:  04/05/21  2:26 PM  Result Value Ref Range   TSH 0.693 0.450 - 4.500 uIU/mL  T4, free     Status: None   Collection Time: 04/05/21  2:26 PM  Result Value Ref Range   Free T4 0.84 0.82 - 1.77 ng/dL   January 09, 2020 biopsy results are benign on bilateral thyroid nodules.   Assessment & Plan:   1. Nodular goiter  2.  Hypothyroidism Her thyroid function tests are such that she would benefit from slight increase in the dose of her levothyroxine.  I discussed and increase her levothyroxine to 75 mcg p.o. daily before breakfast.    - We discussed about the correct intake of her thyroid hormone, on empty stomach at fasting, with water, separated by at least 30 minutes from breakfast and other medications,  and separated by more than 4 hours from calcium, iron, multivitamins, acid reflux medications (PPIs). -Patient is made aware of the fact that thyroid hormone replacement is needed for life, dose to be adjusted by periodic monitoring of thyroid function tests.  Regarding her multinodular goiter:  She is status post fine-needle aspiration of largest/concerning nodules on bilateral thyroid lobes with benign outcomes.  She will not need any definitive antithyroid intervention at this time.    - she is advised to maintain close follow up with Apolonio Schneiders, MD for primary care needs.    I spent 21 minutes in the care of the patient today including review of  labs from Thyroid Function, CMP, and other relevant labs ; imaging/biopsy records (current and previous including abstractions from other facilities); face-to-face time discussing  her lab results and symptoms, medications doses, her options of short and long term treatment based on the latest standards of care / guidelines;   and documenting the encounter.  Mosie Lukes  participated in the discussions, expressed understanding, and voiced agreement with the above plans.  All questions were answered to her satisfaction. she is encouraged to  contact clinic should she have any questions or concerns prior to her return visit.   Follow up plan: Return in about 4 months (around 08/11/2021) for F/U with Pre-visit Labs.   Glade Lloyd, MD [facility] [address] [place], [place] [address] Phone: [phone]  Fax: [phone]     04/12/2021, 1:45 PM  This note was partially dictated with voice recognition software. Similar sounding words can be transcribed inadequately or may not  be corrected upon review.

## 2021-04-30 ENCOUNTER — Ambulatory Visit: Payer: Non-veteran care | Admitting: "Endocrinology

## 2021-08-06 LAB — TSH: TSH: 0.437 u[IU]/mL — ABNORMAL LOW (ref 0.450–4.500)

## 2021-08-06 LAB — T4, FREE: Free T4: 0.98 ng/dL (ref 0.82–1.77)

## 2021-08-12 ENCOUNTER — Encounter: Payer: Self-pay | Admitting: "Endocrinology

## 2021-08-12 ENCOUNTER — Ambulatory Visit (INDEPENDENT_AMBULATORY_CARE_PROVIDER_SITE_OTHER): Payer: No Typology Code available for payment source | Admitting: "Endocrinology

## 2021-08-12 VITALS — BP 110/72 | HR 76 | Ht 63.5 in | Wt 209.0 lb

## 2021-08-12 DIAGNOSIS — E039 Hypothyroidism, unspecified: Secondary | ICD-10-CM

## 2021-08-12 DIAGNOSIS — E782 Mixed hyperlipidemia: Secondary | ICD-10-CM

## 2021-08-12 DIAGNOSIS — E049 Nontoxic goiter, unspecified: Secondary | ICD-10-CM

## 2021-08-12 NOTE — Progress Notes (Signed)
? ?     ?                                           08/12/2021, 9:58 AM ? ?Endocrinology follow-up note ? ?Subjective:  ? ? Patient ID: Margaret Gay, female    DOB: [birth year], PCP Apolonio Schneiders, MD ? ? ?Past Medical History:  ?Diagnosis Date  ? Asthma   ? Fibromyalgia   ? GERD (gastroesophageal reflux disease)   ? Headache(784.0)   ? Seasonal allergies   ? Seizures ([place])   ? Sleep apnea   ? uses cpap  ? TIA (transient ischemic attack)   ? ?Past Surgical History:  ?Procedure Laterality Date  ? ABDOMINAL HYSTERECTOMY    ? ANKLE ARTHROSCOPY    ? rt x2  ? ANKLE ARTHROSCOPY  09/07/2011  ? Procedure: ANKLE ARTHROSCOPY;  Surgeon: Colin Rhein, MD;  Location: [facility];  Service: Orthopedics;  Laterality: Right;  subtalar arthrotomy with tenosynovectomy with extensive debridement  ? APPENDECTOMY    ? OOPHORECTOMY    ? lt  ? ?Social History  ? ?Socioeconomic History  ? Marital status: Single  ?  Spouse name: Not on file  ? Number of children: Not on file  ? Years of education: Not on file  ? Highest education level: Not on file  ?Occupational History  ? Not on file  ?Tobacco Use  ? Smoking status: Never  ? Smokeless tobacco: Not on file  ?Vaping Use  ? Vaping Use: Never used  ?Substance and Sexual Activity  ? Alcohol use: No  ? Drug use: No  ? Sexual activity: Not on file  ?Other Topics Concern  ? Not on file  ?Social History Narrative  ? Not on file  ? ?Social Determinants of Health  ? ?Financial Resource Strain: Not on file  ?Food Insecurity: Not on file  ?Transportation Needs: Not on file  ?Physical Activity: Not on file  ?Stress: Not on file  ?Social Connections: Not on file  ? ?Family History  ?Problem Relation Age of Onset  ? Cancer Mother   ? Hypertension Father   ? Heart attack Father   ? ?Outpatient Encounter Medications as of 08/12/2021  ?Medication Sig  ? IRON, FERROUS SULFATE, PO Take 1 tablet by mouth 3 (three) times a week.  ? Omega-3 Fatty Acids (OMEGA 3 500 PO) Take 1 capsule by mouth  daily.  ? triamcinolone (NASACORT) 55 MCG/ACT AERO nasal inhaler Place 2 sprays into the nose daily.  ? acetaminophen (TYLENOL) 650 MG CR tablet Take 650 mg by mouth every 8 (eight) hours as needed for pain.  ? albuterol (PROVENTIL HFA;VENTOLIN HFA) 108 (90 BASE) MCG/ACT inhaler Inhale 2 puffs into the lungs every 6 (six) hours as needed.  ? amLODipine (NORVASC) 5 MG tablet Take 5 mg by mouth daily.  ? aspirin EC 81 MG tablet Take 81 mg by mouth daily. Swallow whole.  ? budesonide-formoterol (SYMBICORT) 160-4.5 MCG/ACT inhaler Inhale 2 puffs into the lungs 2 (two) times daily.  ? Calcium Carbonate (CALCIUM 500 PO) Take 2 tablets by mouth daily.  ? carboxymethylcellulose (REFRESH PLUS) 0.5 % SOLN Place 1 drop into both eyes 3 (three) times daily as needed.  ? cholecalciferol (VITAMIN D) 25 MCG (1000 UNIT) tablet Take 1,000 Units by mouth daily.  ? cycloSPORINE (RESTASIS) 0.05 % ophthalmic emulsion INSTILL 1 DROP IN [facility]  EYE EVERY 12 HOURS  ? famotidine (PEPCID) 20 MG tablet Take 20 mg by mouth daily.  ? fexofenadine (ALLEGRA) 180 MG tablet Take 180 mg by mouth daily.  ? levothyroxine (SYNTHROID) 75 MCG tablet Take 1 tablet (75 mcg total) by mouth daily before breakfast.  ? lidocaine (LIDODERM) 5 % 2 patches daily.  ? metaxalone (SKELAXIN) 800 MG tablet Take 1 tablet by mouth 3 (three) times daily.  ? sodium bicarbonate 650 MG tablet Take 650 mg by mouth 2 (two) times daily.  ? sodium chloride (OCEAN) 0.65 % SOLN nasal spray Place 1 spray into both nostrils as needed for congestion.  ? tiZANidine (ZANAFLEX) 4 MG capsule Take 4 mg by mouth at bedtime. (Patient not taking: Reported on 08/12/2021)  ? topiramate (TOPAMAX) 100 MG tablet Take 100 mg by mouth 3 (three) times daily.  ? ?No facility-administered encounter medications on file as of 08/12/2021.  ? ?ALLERGIES: ?Allergies  ?Allergen Reactions  ? Compazine [Prochlorperazine Edisylate]   ?  Increase bp  ? Erythromycin Itching  ? Penicillins Swelling  ? Voltaren  [Diclofenac Sodium]   ?  Gi upset  ? ? ?VACCINATION STATUS: ? ?There is no immunization history on file for this patient. ? ?HPI ?Margaret Gay is 56 y.o. female who was recently seen in consultation for multinodular goiter and hypothyroidism.  She is status post fine-needle aspiration of bilateral thyroid nodules with benign findings.  She is returning for follow-up with repeat thyroid function test, for the management of hypothyroidism.  She is currently on stable dose of levothyroxine 75 mcg p.o. daily before breakfast.  She has no new complaints.  By changing her diet over the last 6 months, she has lost 15 pounds progressively.   ? ?-She is known to have nodular goiter at least since 2013.  Her most recent ultrasound showed increase in the size of nodules on bilateral thyroid lobes.  She denies dysphagia, shortness of breath, nor voice change.  ? Her previsit thyroid function tests are consistent with appropriate replacement.   ? She denies any recent major weight change.  She denies palpitations, tremors, heat/cold intolerance.  She denies family history of thyroid malignancy. ? ? ?Review of Systems ? ? ? ?Objective:  ?  ? ?  08/12/2021  ?  9:37 AM 04/12/2021  ?  9:48 AM 10/27/2020  ?  9:42 AM  ?Vitals with BMI  ?Height 5' 3.5" 5' 3.5" 5' 3.5"  ?Weight 209 lbs 223 lbs 13 oz 220 lbs 6 oz  ?BMI 36.44 39.02 38.43  ?Systolic 161 096 045  ?Diastolic 72 62 63  ?Pulse 76 72 80  ? ? ?BP 110/72   Pulse 76   Ht 5' 3.5" (1.613 m)   Wt 209 lb (94.8 kg)   BMI 36.44 kg/m?   ?Wt Readings from Last 3 Encounters:  ?08/12/21 209 lb (94.8 kg)  ?04/12/21 223 lb 12.8 oz (101.5 kg)  ?10/27/20 220 lb 6.4 oz (100 kg)  ?  ?Physical Exam ? ?Constitutional:  Body mass index is 36.44 kg/m?.,  not in acute distress, normal state of mind, + patient wears posture stabilizer due to her propensity for falls and disequilibrium.  She ambulates with a walker. ?Eyes: PERRLA, EOMI, no exophthalmos ?ENT: moist mucous membranes,  + gross  thyromegaly, no gross cervical lymphadenopathy ? ? ?CMP ( most recent) ?CMP  ?   ?Component Value Date/Time  ? NA 143 01/21/2010 1648  ? K 3.3 (L) 01/21/2010 1648  ? CL 117 (  H) 01/21/2010 1648  ? CO2 19 01/21/2010 1648  ? GLUCOSE 141 (H) 01/21/2010 1648  ? BUN 21 01/21/2010 1648  ? CREATININE 1.17 01/21/2010 1648  ? CALCIUM 8.6 01/21/2010 1648  ? PROT 7.4 12/22/2009 1900  ? ALBUMIN 4.1 12/22/2009 1900  ? AST 15 12/22/2009 1900  ? ALT 10 12/22/2009 1900  ? ALKPHOS 66 12/22/2009 1900  ? BILITOT 0.4 12/22/2009 1900  ? GFRNONAA 50 (L) 01/21/2010 1648  ? GFRAA  01/21/2010 1648  ?  >60        ?The eGFR has been calculated ?using the MDRD equation. ?This calculation has not been ?validated in all clinical ?situations. ?eGFR's persistently ?<60 mL/min signify ?possible Chronic Kidney Disease.  ? ? ? ?Diabetic Labs (most recent): ?Lab Results  ?Component Value Date  ? HGBA1C 5.8 07/05/2019  ? ? ? Lipid Panel ( most recent) ?Lipid Panel  ?   ?Component Value Date/Time  ? CHOL 215 (A) 07/05/2019 0000  ? TRIG 165 (A) 07/05/2019 0000  ? HDL 63 07/05/2019 0000  ? ?Reviewing her referral package for thyroid ultrasound from November 12, 2019 compared to another study on August 11, 2014: ?The right lobe measured 3.6 x 4.3 x 6.6 cm.  Thyroid isthmus 0.2 cm.  Left thyroid lobe measured 1.6 x 2.5 x 6.3 cm.  Multiple nodules in both thyroid lobes.  Right thyroid lobe nodule 1 mildly heterogeneous and hypoechoic 1.1 x 2.7 x 2.9 cm, 1.3 x 1.4 x 1.9 cm on the prior exam.  Nodule 2- heterogeneous interpolar nodule measuring 2.8 x 2.6 x 3.9 cm, 3.3 cm in the prior exam.  The lesion is hypovascular and  no microcalcifications were identified.  ? ? Left thyroid lobe nodule 1- 0.4 x 0.5 x 0.6 cm not definitively identified on the prior exam.  Ideally to well defined in follow-up of lung nodule measuring 0.3 x 0.5 x 0.7 cm, approximately 0.5 cm on the prior exam.  Nodule 3- 0.6 x 0.6 x 0.7 cm medially in the lower pole, previously measured 0.7 cm and  appeared unchanged.  Nodule 4-  0.7 x 0.7 x 0.8 cm hypoechoic nodule in the lower pole previously measuring 0.6 x 0.7 x 0.9 cm. ? ? ?Recent Results (from the past 2160 hour(s))  ?TSH     Status: Abnormal  ? Collection Time

## 2021-09-16 ENCOUNTER — Encounter: Payer: Self-pay | Admitting: "Endocrinology

## 2022-02-03 ENCOUNTER — Ambulatory Visit (INDEPENDENT_AMBULATORY_CARE_PROVIDER_SITE_OTHER): Payer: No Typology Code available for payment source | Admitting: "Endocrinology

## 2022-02-03 ENCOUNTER — Encounter: Payer: Self-pay | Admitting: "Endocrinology

## 2022-02-03 VITALS — BP 96/62 | HR 68 | Ht 63.5 in | Wt 206.0 lb

## 2022-02-03 DIAGNOSIS — E039 Hypothyroidism, unspecified: Secondary | ICD-10-CM

## 2022-02-03 DIAGNOSIS — E042 Nontoxic multinodular goiter: Secondary | ICD-10-CM

## 2022-02-03 LAB — TSH: TSH: 0.728 u[IU]/mL (ref 0.450–4.500)

## 2022-02-03 LAB — T4, FREE: Free T4: 1.09 ng/dL (ref 0.82–1.77)

## 2022-02-03 MED ORDER — LEVOTHYROXINE SODIUM 75 MCG PO TABS: 75.0000 ug | ORAL_TABLET | Freq: Every day | ORAL | 1 refills | Status: DC

## 2022-02-03 NOTE — Progress Notes (Signed)
02/03/2022, 12:11 PM  Endocrinology follow-up note  Subjective:    Patient ID: Margaret Gay, female    DOB: October 31, 1965, PCP Apolonio Schneiders, MD   Past Medical History:  Diagnosis Date   Asthma    Fibromyalgia    GERD (gastroesophageal reflux disease)    Headache(784.0)    Seasonal allergies    Seizures ([place])    Sleep apnea    uses cpap   TIA (transient ischemic attack)    Past Surgical History:  Procedure Laterality Date   ABDOMINAL HYSTERECTOMY     ANKLE ARTHROSCOPY     rt x2   ANKLE ARTHROSCOPY  09/07/2011   Procedure: ANKLE ARTHROSCOPY;  Surgeon: Colin Rhein, MD;  Location: [facility];  Service: Orthopedics;  Laterality: Right;  subtalar arthrotomy with tenosynovectomy with extensive debridement   APPENDECTOMY     OOPHORECTOMY     lt   Social History   Socioeconomic History   Marital status: Single    Spouse name: Not on file   Number of children: Not on file   Years of education: Not on file   Highest education level: Not on file  Occupational History   Not on file  Tobacco Use   Smoking status: Never   Smokeless tobacco: Not on file  Vaping Use   Vaping Use: Never used  Substance and Sexual Activity   Alcohol use: No   Drug use: No   Sexual activity: Not on file  Other Topics Concern   Not on file  Social History Narrative   Not on file   Social Determinants of Health   Financial Resource Strain: Not on file  Food Insecurity: Not on file  Transportation Needs: Not on file  Physical Activity: Not on file  Stress: Not on file  Social Connections: Not on file   Family History  Problem Relation Age of Onset   Cancer Mother    Hypertension Father    Heart attack Father    Outpatient Encounter Medications as of 02/03/2022  Medication Sig   acetaminophen (TYLENOL) 650 MG CR tablet Take 650 mg by mouth every 8 (eight) hours as needed for pain.   albuterol (PROVENTIL  HFA;VENTOLIN HFA) 108 (90 BASE) MCG/ACT inhaler Inhale 2 puffs into the lungs every 6 (six) hours as needed.   amLODipine (NORVASC) 5 MG tablet Take 5 mg by mouth daily.   aspirin EC 81 MG tablet Take 81 mg by mouth daily. Swallow whole.   budesonide-formoterol (SYMBICORT) 160-4.5 MCG/ACT inhaler Inhale 2 puffs into the lungs 2 (two) times daily.   Calcium Carbonate (CALCIUM 500 PO) Take 2 tablets by mouth daily.   carboxymethylcellulose (REFRESH PLUS) 0.5 % SOLN Place 1 drop into both eyes 3 (three) times daily as needed.   cholecalciferol (VITAMIN D) 25 MCG (1000 UNIT) tablet Take 1,000 Units by mouth daily.   ciprofloxacin (CIPRO) 500 MG tablet SMARTSIG:1 Tablet(s) By Mouth Every 12 Hours   cyclobenzaprine (FLEXERIL) 5 MG tablet Take 5 mg by mouth 3 (three) times daily.   cycloSPORINE (RESTASIS) 0.05 % ophthalmic emulsion INSTILL 1 DROP IN EACH EYE EVERY 12 HOURS   famotidine (PEPCID) 20 MG tablet Take 20 mg by mouth daily.  fexofenadine (ALLEGRA) 180 MG tablet Take 180 mg by mouth daily.   gabapentin (NEURONTIN) 300 MG capsule Take 300 mg by mouth every 8 (eight) hours.   IRON, FERROUS SULFATE, PO Take 1 tablet by mouth 3 (three) times a week.   levothyroxine (SYNTHROID) 75 MCG tablet Take 1 tablet (75 mcg total) by mouth daily before breakfast.   lidocaine (LIDODERM) 5 % 2 patches daily.   Omega-3 Fatty Acids (OMEGA 3 500 PO) Take 1 capsule by mouth daily.   predniSONE (DELTASONE) 10 MG tablet Take by mouth.   sodium bicarbonate 650 MG tablet Take 650 mg by mouth 2 (two) times daily.   sodium chloride (OCEAN) 0.65 % SOLN nasal spray Place 1 spray into both nostrils as needed for congestion.   topiramate (TOPAMAX) 100 MG tablet Take 100 mg by mouth 3 (three) times daily.   triamcinolone (NASACORT) 55 MCG/ACT AERO nasal inhaler Place 2 sprays into the nose daily.   [DISCONTINUED] levothyroxine (SYNTHROID) 75 MCG tablet Take 1 tablet (75 mcg total) by mouth daily before breakfast.    [DISCONTINUED] metaxalone (SKELAXIN) 800 MG tablet Take 1 tablet by mouth 3 (three) times daily.   [DISCONTINUED] tiZANidine (ZANAFLEX) 4 MG capsule Take 4 mg by mouth at bedtime. (Patient not taking: Reported on 08/12/2021)   No facility-administered encounter medications on file as of 02/03/2022.   ALLERGIES: Allergies  Allergen Reactions   Compazine [Prochlorperazine Edisylate]     Increase bp   Erythromycin Itching   Penicillins Swelling   Voltaren [Diclofenac Sodium]     Gi upset    VACCINATION STATUS:  There is no immunization history on file for this patient.  HPI Margaret Gay is 56 y.o. female who was recently seen in consultation for multinodular goiter and hypothyroidism.  She is status post fine-needle aspiration of bilateral thyroid nodules with benign findings.  She is returning for follow-up with repeat thyroid function test, for the management of hypothyroidism.  She is currently on stable dose of levothyroxine 75 mcg p.o. daily before breakfast.  She has no new complaints today.  Her previsit labs are consistent with appropriate replacement.     -She is known to have nodular goiter at least since 2013.  Her most recent ultrasound showed increase in the size of nodules on bilateral thyroid lobes.  She denies dysphagia, shortness of breath, nor voice change.   Her previsit thyroid function tests are consistent with appropriate replacement.    She denies any recent major weight change.  She denies palpitations, tremors, heat/cold intolerance.  She denies family history of thyroid malignancy. She has hyperlipidemia, not on statins but she is on omega-3 fatty acids.     Review of Systems    Objective:       02/03/2022    9:32 AM 08/12/2021    9:37 AM 04/12/2021    9:48 AM  Vitals with BMI  Height 5' 3.5" 5' 3.5" 5' 3.5"  Weight 206 lbs 209 lbs 223 lbs 13 oz  BMI 35.91 00.71 21.97  Systolic 96 588 325  Diastolic 62 72 62  Pulse 68 76 72    BP 96/62   Pulse 68    Ht 5' 3.5" (1.613 m)   Wt 206 lb (93.4 kg)   BMI 35.92 kg/m   Wt Readings from Last 3 Encounters:  02/03/22 206 lb (93.4 kg)  08/12/21 209 lb (94.8 kg)  04/12/21 223 lb 12.8 oz (101.5 kg)    Physical Exam  Constitutional:  Body  mass index is 35.92 kg/m.,  not in acute distress, normal state of mind, + patient wears posture stabilizer due to her propensity for falls and disequilibrium.  She ambulates with a walker. Eyes: PERRLA, EOMI, no exophthalmos ENT: moist mucous membranes,  + gross thyromegaly, no gross cervical lymphadenopathy   CMP ( most recent) CMP     Component Value Date/Time   NA 143 01/21/2010 1648   K 3.3 (L) 01/21/2010 1648   CL 117 (H) 01/21/2010 1648   CO2 19 01/21/2010 1648   GLUCOSE 141 (H) 01/21/2010 1648   BUN 21 01/21/2010 1648   CREATININE 1.17 01/21/2010 1648   CALCIUM 8.6 01/21/2010 1648   PROT 7.4 12/22/2009 1900   ALBUMIN 4.1 12/22/2009 1900   AST 15 12/22/2009 1900   ALT 10 12/22/2009 1900   ALKPHOS 66 12/22/2009 1900   BILITOT 0.4 12/22/2009 1900   GFRNONAA 50 (L) 01/21/2010 1648   GFRAA  01/21/2010 1648    >60        The eGFR has been calculated using the MDRD equation. This calculation has not been validated in all clinical situations. eGFR's persistently <60 mL/min signify possible Chronic Kidney Disease.     Diabetic Labs (most recent): Lab Results  Component Value Date   HGBA1C 5.8 07/05/2019     Lipid Panel ( most recent) Lipid Panel     Component Value Date/Time   CHOL 215 (A) 07/05/2019 0000   TRIG 165 (A) 07/05/2019 0000   HDL 63 07/05/2019 0000   Reviewing her referral package for thyroid ultrasound from November 12, 2019 compared to another study on August 11, 2014: The right lobe measured 3.6 x 4.3 x 6.6 cm.  Thyroid isthmus 0.2 cm.  Left thyroid lobe measured 1.6 x 2.5 x 6.3 cm.  Multiple nodules in both thyroid lobes.  Right thyroid lobe nodule 1 mildly heterogeneous and hypoechoic 1.1 x 2.7 x 2.9 cm, 1.3 x 1.4 x  1.9 cm on the prior exam.  Nodule 2- heterogeneous interpolar nodule measuring 2.8 x 2.6 x 3.9 cm, 3.3 cm in the prior exam.  The lesion is hypovascular and  no microcalcifications were identified.    Left thyroid lobe nodule 1- 0.4 x 0.5 x 0.6 cm not definitively identified on the prior exam.  Ideally to well defined in follow-up of lung nodule measuring 0.3 x 0.5 x 0.7 cm, approximately 0.5 cm on the prior exam.  Nodule 3- 0.6 x 0.6 x 0.7 cm medially in the lower pole, previously measured 0.7 cm and appeared unchanged.  Nodule 4-  0.7 x 0.7 x 0.8 cm hypoechoic nodule in the lower pole previously measuring 0.6 x 0.7 x 0.9 cm.   Recent Results (from the past 2160 hour(s))  TSH     Status: None   Collection Time: 02/02/22 10:26 AM  Result Value Ref Range   TSH 0.728 0.450 - 4.500 uIU/mL  T4, free     Status: None   Collection Time: 02/02/22 10:26 AM  Result Value Ref Range   Free T4 1.09 0.82 - 1.77 ng/dL   January 09, 2020 biopsy results are benign on bilateral thyroid nodules.   Assessment & Plan:   1. Nodular goiter  2.  Hypothyroidism  3.  Hyperlipidemia Her thyroid function tests are consistent with appropriate replacement.  I advised her to continue levothyroxine 75 mcg p.o. daily before breakfast.    - We discussed about the correct intake of her thyroid hormone, on empty stomach at fasting,  with water, separated by at least 30 minutes from breakfast and other medications,  and separated by more than 4 hours from calcium, iron, multivitamins, acid reflux medications (PPIs). -Patient is made aware of the fact that thyroid hormone replacement is needed for life, dose to be adjusted by periodic monitoring of thyroid function tests.    Regarding her multinodular goiter:  She is status post fine-needle aspiration of largest/concerning nodules on bilateral thyroid lobes with benign outcomes.  She will not need any definitive antithyroid intervention at this time.   She is encouraged  on the diet plan she is on which is helping her lose weight.  [employer] plant-based diet was emphasized.  She wishes to avoid statins, and reports that she is working on her lipid problems with her PCP.      - she is advised to maintain close follow up with Apolonio Schneiders, MD for primary care needs.    I spent 21 minutes in the care of the patient today including review of labs from Thyroid Function, CMP, and other relevant labs ; imaging/biopsy records (current and previous including abstractions from other facilities); face-to-face time discussing  her lab results and symptoms, medications doses, her options of short and long term treatment based on the latest standards of care / guidelines;   and documenting the encounter.  Margaret Gay  participated in the discussions, expressed understanding, and voiced agreement with the above plans.  All questions were answered to her satisfaction. she is encouraged to contact clinic should she have any questions or concerns prior to her return visit.   Follow up plan: Return in about 6 months (around 08/05/2022) for F/U with Pre-visit Labs.   Glade Lloyd, MD [facility] [address] [place], [place] [address] Phone: [phone]  Fax: [phone]     02/03/2022, 12:11 PM  This note was partially dictated with voice recognition software. Similar sounding words can be transcribed inadequately or may not  be corrected upon review.

## 2022-02-17 ENCOUNTER — Ambulatory Visit: Payer: No Typology Code available for payment source | Admitting: "Endocrinology

## 2022-08-03 ENCOUNTER — Other Ambulatory Visit: Payer: Self-pay

## 2022-08-03 ENCOUNTER — Telehealth: Payer: Self-pay | Admitting: "Endocrinology

## 2022-08-03 DIAGNOSIS — E039 Hypothyroidism, unspecified: Secondary | ICD-10-CM

## 2022-08-03 NOTE — Telephone Encounter (Signed)
Labs updated and sent to [facility]. 

## 2022-08-03 NOTE — Telephone Encounter (Signed)
These labs need to be updated please

## 2022-08-05 ENCOUNTER — Ambulatory Visit: Payer: No Typology Code available for payment source | Admitting: "Endocrinology

## 2022-08-11 LAB — T4, FREE: Free T4: 1.02 ng/dL (ref 0.82–1.77)

## 2022-08-11 LAB — TSH: TSH: 0.49 u[IU]/mL (ref 0.450–4.500)

## 2022-08-12 ENCOUNTER — Encounter: Payer: Self-pay | Admitting: "Endocrinology

## 2022-08-12 ENCOUNTER — Ambulatory Visit (INDEPENDENT_AMBULATORY_CARE_PROVIDER_SITE_OTHER): Payer: No Typology Code available for payment source | Admitting: "Endocrinology

## 2022-08-12 VITALS — BP 102/64 | HR 60 | Ht 63.5 in | Wt 196.8 lb

## 2022-08-12 DIAGNOSIS — E039 Hypothyroidism, unspecified: Secondary | ICD-10-CM

## 2022-08-12 DIAGNOSIS — E042 Nontoxic multinodular goiter: Secondary | ICD-10-CM

## 2022-08-12 MED ORDER — LEVOTHYROXINE SODIUM 75 MCG PO TABS: 75.0000 ug | ORAL_TABLET | Freq: Every day | ORAL | 1 refills | Status: DC

## 2022-08-12 NOTE — Progress Notes (Signed)
08/12/2022, 1:55 PM  Endocrinology follow-up note  Subjective:    Patient ID: Margaret Gay, female    DOB: [birth year], PCP Pcp, No   Past Medical History:  Diagnosis Date   Asthma    Fibromyalgia    GERD (gastroesophageal reflux disease)    Headache(784.0)    Seasonal allergies    Seizures    Sleep apnea    uses cpap   TIA (transient ischemic attack)    Past Surgical History:  Procedure Laterality Date   ABDOMINAL HYSTERECTOMY     ANKLE ARTHROSCOPY     rt x2   ANKLE ARTHROSCOPY  09/07/2011   Procedure: ANKLE ARTHROSCOPY;  Surgeon: Sherri Rad, MD;  Location: [facility];  Service: Orthopedics;  Laterality: Right;  subtalar arthrotomy with tenosynovectomy with extensive debridement   APPENDECTOMY     OOPHORECTOMY     lt   Social History   Socioeconomic History   Marital status: Single    Spouse name: Not on file   Number of children: Not on file   Years of education: Not on file   Highest education level: Not on file  Occupational History   Not on file  Tobacco Use   Smoking status: Never   Smokeless tobacco: Not on file  Vaping Use   Vaping Use: Never used  Substance and Sexual Activity   Alcohol use: No   Drug use: No   Sexual activity: Not on file  Other Topics Concern   Not on file  Social History Narrative   Not on file   Social Determinants of Health   Financial Resource Strain: Not on file  Food Insecurity: Not on file  Transportation Needs: Not on file  Physical Activity: Not on file  Stress: Not on file  Social Connections: Not on file   Family History  Problem Relation Age of Onset   Cancer Mother    Hypertension Father    Heart attack Father    Outpatient Encounter Medications as of 08/12/2022  Medication Sig   calcium carbonate (TUMS - DOSED IN MG ELEMENTAL CALCIUM) 500 MG chewable tablet Chew 1 tablet by mouth as needed for indigestion or heartburn.   pravastatin  (PRAVACHOL) 20 MG tablet Take 20 mg by mouth daily.   acetaminophen (TYLENOL) 650 MG CR tablet Take 650 mg by mouth every 8 (eight) hours as needed for pain.   albuterol (PROVENTIL HFA;VENTOLIN HFA) 108 (90 BASE) MCG/ACT inhaler Inhale 2 puffs into the lungs every 6 (six) hours as needed.   amLODipine (NORVASC) 5 MG tablet Take 5 mg by mouth daily.   aspirin EC 81 MG tablet Take 81 mg by mouth daily. Swallow whole.   budesonide-formoterol (SYMBICORT) 160-4.5 MCG/ACT inhaler Inhale 2 puffs into the lungs 2 (two) times daily.   Calcium Carbonate (CALCIUM 500 PO) Take 2 tablets by mouth daily.   carboxymethylcellulose (REFRESH PLUS) 0.5 % SOLN Place 1 drop into both eyes 3 (three) times daily as needed.   cholecalciferol (VITAMIN D) 25 MCG (1000 UNIT) tablet Take 1,000 Units by mouth daily.   ciprofloxacin (CIPRO) 500 MG tablet SMARTSIG:1 Tablet(s) By Mouth Every 12 Hours   cyclobenzaprine (FLEXERIL) 5 MG tablet Take 5  mg by mouth 3 (three) times daily.   cycloSPORINE (RESTASIS) 0.05 % ophthalmic emulsion INSTILL 1 DROP IN EACH EYE EVERY 12 HOURS   famotidine (PEPCID) 20 MG tablet Take 20 mg by mouth daily.   fexofenadine (ALLEGRA) 180 MG tablet Take 180 mg by mouth daily.   gabapentin (NEURONTIN) 300 MG capsule Take 300 mg by mouth every 8 (eight) hours.   IRON, FERROUS SULFATE, PO Take 1 tablet by mouth 3 (three) times a week.   levothyroxine (SYNTHROID) 75 MCG tablet Take 1 tablet (75 mcg total) by mouth daily before breakfast.   lidocaine (LIDODERM) 5 % 2 patches daily.   Omega-3 Fatty Acids (OMEGA 3 500 PO) Take 1 capsule by mouth daily.   predniSONE (DELTASONE) 10 MG tablet Take by mouth.   sodium bicarbonate 650 MG tablet Take 650 mg by mouth 2 (two) times daily.   sodium chloride (OCEAN) 0.65 % SOLN nasal spray Place 1 spray into both nostrils as needed for congestion.   topiramate (TOPAMAX) 100 MG tablet Take 100 mg by mouth 3 (three) times daily.   triamcinolone (NASACORT) 55 MCG/ACT  AERO nasal inhaler Place 2 sprays into the nose daily.   [DISCONTINUED] levothyroxine (SYNTHROID) 75 MCG tablet Take 1 tablet (75 mcg total) by mouth daily before breakfast.   No facility-administered encounter medications on file as of 08/12/2022.   ALLERGIES: Allergies  Allergen Reactions   Compazine [Prochlorperazine Edisylate]     Increase bp   Erythromycin Itching   Penicillins Swelling   Voltaren [Diclofenac Sodium]     Gi upset    VACCINATION STATUS: Immunization History  Administered Date(s) Administered   Covid-19, Mrna,Vaccine(Spikevax)7yrs and older 03/17/2022    HPI Margaret Gay is 57 y.o. female who was recently seen in consultation for multinodular goiter and hypothyroidism.  She is status post fine-needle aspiration of bilateral thyroid nodules with benign findings.  She is returning for follow-up with repeat thyroid function test, for the management of hypothyroidism.  She is currently on levothyroxine 75 mcg p.o. daily before breakfast.  She has no new complaints.  Her previsit thyroid function tests are consistent with appropriate replacement.    -She is known to have nodular goiter at least since 2013.  Her most recent ultrasound showed increase in the size of nodules on bilateral thyroid lobes.  She denies dysphagia, shortness of breath, nor voice change.   Her previsit thyroid function tests are consistent with appropriate replacement.    She denies any recent major weight change.  She denies palpitations, tremors, heat/cold intolerance.  She denies family history of thyroid malignancy. She has hyperlipidemia, not on statins but she is on omega-3 fatty acids.     Review of Systems    Objective:       08/12/2022   10:45 AM 02/03/2022    9:32 AM 08/12/2021    9:37 AM  Vitals with BMI  Height 5' 3.5" 5' 3.5" 5' 3.5"  Weight 196 lbs 13 oz 206 lbs 209 lbs  BMI 34.31 35.91 36.44  Systolic 102 96 110  Diastolic 64 62 72  Pulse 60 68 76    BP 102/64    Pulse 60   Ht 5' 3.5" (1.613 m)   Wt 196 lb 12.8 oz (89.3 kg)   BMI 34.31 kg/m   Wt Readings from Last 3 Encounters:  08/12/22 196 lb 12.8 oz (89.3 kg)  02/03/22 206 lb (93.4 kg)  08/12/21 209 lb (94.8 kg)    Physical Exam  Constitutional:  Body mass index is 34.31 kg/m.,  not in acute distress, normal state of mind, + patient wears posture stabilizer due to her propensity for falls and disequilibrium.  She ambulates with a walker. Eyes: PERRLA, EOMI, no exophthalmos ENT: moist mucous membranes,  + gross thyromegaly, no gross cervical lymphadenopathy   CMP ( most recent) CMP     Component Value Date/Time   NA 143 01/21/2010 1648   K 3.3 (L) 01/21/2010 1648   CL 117 (H) 01/21/2010 1648   CO2 19 01/21/2010 1648   GLUCOSE 141 (H) 01/21/2010 1648   BUN 21 01/21/2010 1648   CREATININE 1.17 01/21/2010 1648   CALCIUM 8.6 01/21/2010 1648   PROT 7.4 12/22/2009 1900   ALBUMIN 4.1 12/22/2009 1900   AST 15 12/22/2009 1900   ALT 10 12/22/2009 1900   ALKPHOS 66 12/22/2009 1900   BILITOT 0.4 12/22/2009 1900   GFRNONAA 50 (L) 01/21/2010 1648   GFRAA  01/21/2010 1648    >60        The eGFR has been calculated using the MDRD equation. This calculation has not been validated in all clinical situations. eGFR's persistently <60 mL/min signify possible Chronic Kidney Disease.     Diabetic Labs (most recent): Lab Results  Component Value Date   HGBA1C 5.8 07/05/2019     Lipid Panel ( most recent) Lipid Panel     Component Value Date/Time   CHOL 215 (A) 07/05/2019 0000   TRIG 165 (A) 07/05/2019 0000   HDL 63 07/05/2019 0000   Reviewing her referral package for thyroid ultrasound from November 12, 2019 compared to another study on August 11, 2014: The right lobe measured 3.6 x 4.3 x 6.6 cm.  Thyroid isthmus 0.2 cm.  Left thyroid lobe measured 1.6 x 2.5 x 6.3 cm.  Multiple nodules in both thyroid lobes.  Right thyroid lobe nodule 1 mildly heterogeneous and hypoechoic 1.1 x 2.7 x 2.9  cm, 1.3 x 1.4 x 1.9 cm on the prior exam.  Nodule 2- heterogeneous interpolar nodule measuring 2.8 x 2.6 x 3.9 cm, 3.3 cm in the prior exam.  The lesion is hypovascular and  no microcalcifications were identified.    Left thyroid lobe nodule 1- 0.4 x 0.5 x 0.6 cm not definitively identified on the prior exam.  Ideally to well defined in follow-up of lung nodule measuring 0.3 x 0.5 x 0.7 cm, approximately 0.5 cm on the prior exam.  Nodule 3- 0.6 x 0.6 x 0.7 cm medially in the lower pole, previously measured 0.7 cm and appeared unchanged.  Nodule 4-  0.7 x 0.7 x 0.8 cm hypoechoic nodule in the lower pole previously measuring 0.6 x 0.7 x 0.9 cm.   Recent Results (from the past 2160 hour(s))  TSH     Status: None   Collection Time: 08/10/22  1:51 PM  Result Value Ref Range   TSH 0.490 0.450 - 4.500 uIU/mL  T4, Free     Status: None   Collection Time: 08/10/22  1:51 PM  Result Value Ref Range   Free T4 1.02 0.82 - 1.77 ng/dL   January 09, 2020 biopsy results are benign on bilateral thyroid nodules.   Assessment & Plan:   1. Nodular goiter  2.  Hypothyroidism  3.  Hyperlipidemia Her thyroid function tests are consistent with appropriate replacement.  She is advised to continue levothyroxine 75 mcg p.o. daily before breakfast.    - We discussed about the correct intake of her thyroid hormone,  on empty stomach at fasting, with water, separated by at least 30 minutes from breakfast and other medications,  and separated by more than 4 hours from calcium, iron, multivitamins, acid reflux medications (PPIs). -Patient is made aware of the fact that thyroid hormone replacement is needed for life, dose to be adjusted by periodic monitoring of thyroid function tests.  Regarding her multinodular goiter:  She is status post fine-needle aspiration of largest/concerning nodules on bilateral thyroid lobes with benign outcomes.  She will not need any definitive antithyroid intervention at this time.   In  light of her hyperlipidemia, currently on atorvastatin, she is encouraged on the diet plan she is on which is helping her lose weight.  [employer] plant-based diet was emphasized.  She wishes to avoid statins, and reports that she is working on her lipid problems with her PCP.     - she is advised to maintain close follow up with Pcp, No for primary care needs.   I spent  22  minutes in the care of the patient today including review of labs from Thyroid Function, CMP, and other relevant labs ; imaging/biopsy records (current and previous including abstractions from other facilities); face-to-face time discussing  her lab results and symptoms, medications doses, her options of short and long term treatment based on the latest standards of care / guidelines;   and documenting the encounter.  [name]  participated in the discussions, expressed understanding, and voiced agreement with the above plans.  All questions were answered to her satisfaction. she is encouraged to contact clinic should she have any questions or concerns prior to her return visit.   Follow up plan: Return in about 6 months (around 02/11/2023) for F/U with Pre-visit Labs.   Marquis Lunch, MD [facility] [address] [place], [place] [address] Phone: [phone]  Fax: [phone]     08/12/2022, 1:55 PM  This note was partially dictated with voice recognition software. Similar sounding words can be transcribed inadequately or may not  be corrected upon review.

## 2022-12-19 ENCOUNTER — Encounter: Payer: No Typology Code available for payment source | Attending: Internal Medicine | Admitting: Nutrition

## 2022-12-19 ENCOUNTER — Encounter: Payer: Self-pay | Admitting: Nutrition

## 2022-12-19 VITALS — Ht 63.5 in | Wt 204.0 lb

## 2022-12-19 DIAGNOSIS — R7303 Prediabetes: Secondary | ICD-10-CM | POA: Insufficient documentation

## 2022-12-19 DIAGNOSIS — E782 Mixed hyperlipidemia: Secondary | ICD-10-CM | POA: Diagnosis present

## 2022-12-19 NOTE — Patient Instructions (Addendum)
Goals  Cut back on eating out. Eat three meals per day Increase exercise to 15-30 minutes 3 times per week. Get A1C down to 5.7%.   May try [employer] Power Bowl Meals for meal replacements as needed.

## 2022-12-19 NOTE — Progress Notes (Signed)
Medical Nutrition Therapy  Appointment Start time:  0900  Appointment End time:  1000  Primary concerns today: Pre Diabetes  Referral diagnosis: R73.03 Preferred learning style: No Preference  Learning readiness: Ready    NUTRITION ASSESSMENT  57 yr old bfemale referred for pre diabetes. She is unaware of her blood sugar levels or A1C. PCP is Dr. Willa Rough with the [facility] in [place], [place]. No recent labs for review. Last A1C 6.1% 12//22.  Currently has an abdominal brace that she has been wearing for about a year. Home weight without brace was 195 lbs. Here in office with brace is 204 lbs. Weight gain has been gradual over the last few weeks/months. 192 lbs is previous weight in the last weeks before her brace. Now has left leg issues with her tendons and not able to exercise much at all. She is frustrated with her 10 lb weight gain . She notes she only eats 2 meals per day.  Obesity: BMI 35 Last A1C 6.1% 12/22.  She notes one of her MD's just prescribed her some lasix to take to help with getting rid of some fluid that is accumulating in her feet.  Sees Nephrologist with CKD stg 3, Dr. Fransico Him Endcrinology for her Thyroid.  Has spinal stenosis. Actvity; Not able to do a lot. Lives by herself with a cat.  Usually cooks mostly at home, eats out sometimes. Likes fish.    Anthropometrics  Wt Readings from Last 3 Encounters:  12/19/22 204 lb (92.5 kg)  08/12/22 196 lb 12.8 oz (89.3 kg)  02/03/22 206 lb (93.4 kg)   Ht Readings from Last 3 Encounters:  12/19/22 5' 3.5" (1.613 m)  08/12/22 5' 3.5" (1.613 m)  02/03/22 5' 3.5" (1.613 m)   Body mass index is 35.57 kg/m. @BMIFA @ Facility age limit for growth %iles is 20 years. Facility age limit for growth %iles is 20 years.    Clinical Medical Hx: Multinodular thyroid, Hyperlipidemia, elevated BS. Medications: See chart Labs: Renal: Cret 1.32 mg/dl, eGFR 47 mg/dl. A1C 6.1% 12/22.  Lipid Panel     Component Value Date/Time   CHOL  215 (A) 07/05/2019 0000   TRIG 165 (A) 07/05/2019 0000   HDL 63 07/05/2019 0000   . Notable Signs/Symptoms: Weight gain,   Lifestyle & Dietary Hx LIves in a house with a friend.  Estimated daily fluid intake: 40 oz Supplements:  Sleep: 4-5 hrs Stress / self-care: None issues. Current average weekly physical activity: walk a little   24-Hr Dietary Recall First Meal: Eats somedays; pancakes/sausage; or usually oatmeal and sausage and herbal tea, water Snack:  Second Meal: salmon and rice; normally- skips lunch. Snack: Fruit sometimes. Third Meal: Salad or baked turkey/chicken, some kind of vegetable, water or [employer] Fried fish and baked potato, water, Snack: Fruit Beverages: water  Estimated Energy Needs Calories: 1200 Carbohydrate: 135g Protein: 90g Fat: 33g   NUTRITION DIAGNOSIS  NI-1.7 Predicted excessive energy intake As related to excess calorie and CHO intake.  As evidenced by A1C 6.1% and BMI 35 and only eating 2 meals per day with less activity.Marland Kitchen   NUTRITION INTERVENTION  Nutrition education (E-1) on the following topics:  Nutrition and Pre-Diabetes education provided on My Plate, CHO counting, meal planning, portion sizes, timing of meals, avoiding snacks between meals unless having a low blood sugar, target ranges for A1C and blood sugars, signs/symptoms and treatment of hyper/hypoglycemia, monitoring blood sugars, taking medications as prescribed, benefits of exercising 30 minutes per day and prevention of  complications of DM.   Lifestyle Medicine  - Whole Food, Plant Predominant Nutrition is highly recommended: Eat Plenty of vegetables, Mushrooms, fruits, Legumes, Whole Grains, Nuts, seeds in lieu of processed meats, processed snacks/pastries red meat, poultry, eggs.    -It is better to avoid simple carbohydrates including: Cakes, Sweet Desserts, Ice Cream, Soda (diet and regular), Sweet Tea, Candies, Chips, Cookies, Store Bought Juices, Alcohol in Excess  of  1-2 drinks a day, Lemonade,  Artificial Sweeteners, Doughnuts, Coffee Creamers, "Sugar-free" Products, etc, etc.  This is not a complete list.....  Exercise: If you are able: 30 -60 minutes a day ,4 days a week, or 150 minutes a week.  The longer the better.  Combine stretch, strength, and aerobic activities.  If you were told in the past that you have high risk for cardiovascular diseases, you may seek evaluation by your heart doctor prior to initiating moderate to intense exercise programs.   Handouts Provided Include  Lifestyle Medicine Handouts  Learning Style & Readiness for Change Teaching method utilized: Visual & Auditory  Demonstrated degree of understanding via: Teach Back  Barriers to learning/adherence to lifestyle change: Physical limitations with her back brace, knee brace and other medical conditions.  Goals Established by Pt Goals  Cut back on eating out. Eat three meals per day Increase exercise to 15-30 minutes 3 times per week. Get A1C down to 5.7%.       May try Healthy Choice Power Bowl Meals for meal replacements as needed.   MONITORING & EVALUATION Dietary intake, weekly physical activity, and weight in 3 months.  Next Steps  Patient is to work on meal planning and eating more whole plant based foods. Marland Kitchen

## 2023-01-13 LAB — LAB REPORT - SCANNED
A1c: 6
EGFR: 52

## 2023-02-10 LAB — TSH: TSH: 0.582 u[IU]/mL (ref 0.450–4.500)

## 2023-02-10 LAB — T4, FREE: Free T4: 1.03 ng/dL (ref 0.82–1.77)

## 2023-02-14 ENCOUNTER — Encounter: Payer: Self-pay | Admitting: Nutrition

## 2023-02-14 ENCOUNTER — Encounter: Payer: No Typology Code available for payment source | Attending: Internal Medicine | Admitting: Nutrition

## 2023-02-14 ENCOUNTER — Encounter: Payer: Self-pay | Admitting: "Endocrinology

## 2023-02-14 ENCOUNTER — Ambulatory Visit (INDEPENDENT_AMBULATORY_CARE_PROVIDER_SITE_OTHER): Payer: No Typology Code available for payment source | Admitting: "Endocrinology

## 2023-02-14 VITALS — Ht 63.5 in | Wt 208.0 lb

## 2023-02-14 VITALS — BP 116/70 | HR 80 | Ht 63.5 in | Wt 208.0 lb

## 2023-02-14 DIAGNOSIS — R7303 Prediabetes: Secondary | ICD-10-CM | POA: Insufficient documentation

## 2023-02-14 DIAGNOSIS — N183 Chronic kidney disease, stage 3 unspecified: Secondary | ICD-10-CM | POA: Insufficient documentation

## 2023-02-14 DIAGNOSIS — E042 Nontoxic multinodular goiter: Secondary | ICD-10-CM

## 2023-02-14 DIAGNOSIS — E039 Hypothyroidism, unspecified: Secondary | ICD-10-CM

## 2023-02-14 DIAGNOSIS — E66812 Obesity, class 2: Secondary | ICD-10-CM | POA: Insufficient documentation

## 2023-02-14 DIAGNOSIS — E782 Mixed hyperlipidemia: Secondary | ICD-10-CM | POA: Insufficient documentation

## 2023-02-14 NOTE — Progress Notes (Signed)
02/14/2023, 11:44 AM  Endocrinology follow-up note  Subjective:    Patient ID: Margaret Gay, female    DOB: 1965-07-22, PCP Pcp, No   Past Medical History:  Diagnosis Date   Asthma    Fibromyalgia    GERD (gastroesophageal reflux disease)    Headache(784.0)    Seasonal allergies    Seizures (HCC)    Sleep apnea    uses cpap   TIA (transient ischemic attack)    Past Surgical History:  Procedure Laterality Date   ABDOMINAL HYSTERECTOMY     ANKLE ARTHROSCOPY     rt x2   ANKLE ARTHROSCOPY  09/07/2011   Procedure: ANKLE ARTHROSCOPY;  Surgeon: Sherri Rad, MD;  Location: [facility];  Service: Orthopedics;  Laterality: Right;  subtalar arthrotomy with tenosynovectomy with extensive debridement   APPENDECTOMY     OOPHORECTOMY     lt   Social History   Socioeconomic History   Marital status: Single    Spouse name: Not on file   Number of children: Not on file   Years of education: Not on file   Highest education level: Not on file  Occupational History   Not on file  Tobacco Use   Smoking status: Never   Smokeless tobacco: Not on file  Vaping Use   Vaping status: Never Used  Substance and Sexual Activity   Alcohol use: No   Drug use: No   Sexual activity: Not on file  Other Topics Concern   Not on file  Social History Narrative   Not on file   Social Determinants of Health   Financial Resource Strain: Not on file  Food Insecurity: Not on file  Transportation Needs: Not on file  Physical Activity: Not on file  Stress: Not on file  Social Connections: Not on file   Family History  Problem Relation Age of Onset   Cancer Mother    Hypertension Father    Heart attack Father    Outpatient Encounter Medications as of 02/14/2023  Medication Sig   Dextromethorphan-guaiFENesin (MUCINEX DM PO) Take 1 tablet by mouth every 12 (twelve) hours.   furosemide (LASIX) 40 MG tablet Take 40 mg by  mouth daily.   lidocaine (LIDODERM) 5 % 2 patches daily.   acetaminophen (TYLENOL) 650 MG CR tablet Take 650 mg by mouth every 8 (eight) hours as needed for pain.   albuterol (PROVENTIL HFA;VENTOLIN HFA) 108 (90 BASE) MCG/ACT inhaler Inhale 2 puffs into the lungs every 6 (six) hours as needed.   amLODipine (NORVASC) 5 MG tablet Take 5 mg by mouth daily.   aspirin EC 81 MG tablet Take 81 mg by mouth daily. Swallow whole.   budesonide-formoterol (SYMBICORT) 160-4.5 MCG/ACT inhaler Inhale 2 puffs into the lungs 2 (two) times daily.   Calcium Carbonate (CALCIUM 500 PO) Take 2 tablets by mouth daily.   calcium carbonate (TUMS - DOSED IN MG ELEMENTAL CALCIUM) 500 MG chewable tablet Chew 1 tablet by mouth as needed for indigestion or heartburn.   carboxymethylcellulose (REFRESH PLUS) 0.5 % SOLN Place 1 drop into both eyes 3 (three) times daily as needed.   cholecalciferol (VITAMIN D) 25 MCG (1000 UNIT) tablet Take 1,000 Units by  mouth daily.   ciprofloxacin (CIPRO) 500 MG tablet SMARTSIG:1 Tablet(s) By Mouth Every 12 Hours   cyclobenzaprine (FLEXERIL) 5 MG tablet Take 5 mg by mouth 3 (three) times daily.   cycloSPORINE (RESTASIS) 0.05 % ophthalmic emulsion INSTILL 1 DROP IN EACH EYE EVERY 12 HOURS   famotidine (PEPCID) 20 MG tablet Take 20 mg by mouth daily.   fexofenadine (ALLEGRA) 180 MG tablet Take 180 mg by mouth daily.   levothyroxine (SYNTHROID) 75 MCG tablet Take 1 tablet (75 mcg total) by mouth daily before breakfast.   pravastatin (PRAVACHOL) 20 MG tablet Take 20 mg by mouth daily.   sodium bicarbonate 650 MG tablet Take 650 mg by mouth 2 (two) times daily.   sodium chloride (OCEAN) 0.65 % SOLN nasal spray Place 1 spray into both nostrils as needed for congestion.   topiramate (TOPAMAX) 100 MG tablet Take 100 mg by mouth 3 (three) times daily.   triamcinolone (NASACORT) 55 MCG/ACT AERO nasal inhaler Place 2 sprays into the nose daily.   [DISCONTINUED] gabapentin (NEURONTIN) 300 MG capsule  Take 300 mg by mouth every 8 (eight) hours. (Patient not taking: Reported on 12/19/2022)   [DISCONTINUED] IRON, FERROUS SULFATE, PO Take 1 tablet by mouth 3 (three) times a week.   [DISCONTINUED] Omega-3 Fatty Acids (OMEGA 3 500 PO) Take 1 capsule by mouth daily. (Patient not taking: Reported on 12/19/2022)   [DISCONTINUED] predniSONE (DELTASONE) 10 MG tablet Take by mouth. (Patient not taking: Reported on 12/19/2022)   No facility-administered encounter medications on file as of 02/14/2023.   ALLERGIES: Allergies  Allergen Reactions   Compazine [Prochlorperazine Edisylate]     Increase bp   Erythromycin Itching   Penicillins Swelling   Voltaren [Diclofenac Sodium]     Gi upset    VACCINATION STATUS: Immunization History  Administered Date(s) Administered   Moderna Covid-19 Fall Seasonal Vaccine 59yrs & older 03/17/2022, 01/24/2023    HPI Margaret Gay is 57 y.o. female who was recently seen in consultation for multinodular goiter and hypothyroidism.  She is status post fine-needle aspiration of bilateral thyroid nodules with benign findings.  She is returning for follow-up with repeat thyroid function test, for the management of hypothyroidism.  She is currently on levothyroxine 75 mcg p.o. daily before breakfast.  She has no new complaints.  Her previsit thyroid function tests are concerned with appropriate replacement.   -She is known to have nodular goiter at least since 2013.  Her most recent ultrasound showed increase in the size of nodules on bilateral thyroid lobes.  She denies dysphagia, shortness of breath, nor voice change.  She does not have a repeat ultrasound since 2021.  Her previsit thyroid function tests are consistent with appropriate replacement.    She denies any recent major weight change.  She denies palpitations, tremors, heat/cold intolerance.  She denies family history of thyroid malignancy. She has hyperlipidemia, not on statins but she is on omega-3 fatty acids.      Review of Systems    Objective:       02/14/2023   10:31 AM 02/14/2023    9:10 AM 12/19/2022    9:03 AM  Vitals with BMI  Height 5' 3.5" 5' 3.5" 5' 3.5"  Weight 208 lbs 208 lbs 204 lbs  BMI 36.26 36.26 35.57  Systolic  116   Diastolic  70   Pulse  80     BP 116/70   Pulse 80   Ht 5' 3.5" (1.613 m)   Wt 208 lb (  94.3 kg)   BMI 36.27 kg/m   Wt Readings from Last 3 Encounters:  02/14/23 208 lb (94.3 kg)  02/14/23 208 lb (94.3 kg)  12/19/22 204 lb (92.5 kg)    Physical Exam  Constitutional:  Body mass index is 36.27 kg/m.,  not in acute distress, normal state of mind, + patient wears posture stabilizer due to her propensity for falls and disequilibrium.  She ambulates with a walker. Eyes: PERRLA, EOMI, no exophthalmos ENT: moist mucous membranes,  + gross thyromegaly, no gross cervical lymphadenopathy   CMP ( most recent) CMP     Component Value Date/Time   NA 143 01/21/2010 1648   K 3.3 (L) 01/21/2010 1648   CL 117 (H) 01/21/2010 1648   CO2 19 01/21/2010 1648   GLUCOSE 141 (H) 01/21/2010 1648   BUN 21 01/21/2010 1648   CREATININE 1.17 01/21/2010 1648   CALCIUM 8.6 01/21/2010 1648   PROT 7.4 12/22/2009 1900   ALBUMIN 4.1 12/22/2009 1900   AST 15 12/22/2009 1900   ALT 10 12/22/2009 1900   ALKPHOS 66 12/22/2009 1900   BILITOT 0.4 12/22/2009 1900   GFRNONAA 50 (L) 01/21/2010 1648   GFRAA  01/21/2010 1648    >60        The eGFR has been calculated using the MDRD equation. This calculation has not been validated in all clinical situations. eGFR's persistently <60 mL/min signify possible Chronic Kidney Disease.     Diabetic Labs (most recent): Lab Results  Component Value Date   HGBA1C 5.8 07/05/2019     Lipid Panel ( most recent) Lipid Panel     Component Value Date/Time   CHOL 215 (A) 07/05/2019 0000   TRIG 165 (A) 07/05/2019 0000   HDL 63 07/05/2019 0000   Reviewing her referral package for thyroid ultrasound from November 12, 2019  compared to another study on August 11, 2014: The right lobe measured 3.6 x 4.3 x 6.6 cm.  Thyroid isthmus 0.2 cm.  Left thyroid lobe measured 1.6 x 2.5 x 6.3 cm.  Multiple nodules in both thyroid lobes.  Right thyroid lobe nodule 1 mildly heterogeneous and hypoechoic 1.1 x 2.7 x 2.9 cm, 1.3 x 1.4 x 1.9 cm on the prior exam.  Nodule 2- heterogeneous interpolar nodule measuring 2.8 x 2.6 x 3.9 cm, 3.3 cm in the prior exam.  The lesion is hypovascular and  no microcalcifications were identified.    Left thyroid lobe nodule 1- 0.4 x 0.5 x 0.6 cm not definitively identified on the prior exam.  Ideally to well defined in follow-up of lung nodule measuring 0.3 x 0.5 x 0.7 cm, approximately 0.5 cm on the prior exam.  Nodule 3- 0.6 x 0.6 x 0.7 cm medially in the lower pole, previously measured 0.7 cm and appeared unchanged.  Nodule 4-  0.7 x 0.7 x 0.8 cm hypoechoic nodule in the lower pole previously measuring 0.6 x 0.7 x 0.9 cm.   Recent Results (from the past 2160 hour(s))  TSH     Status: None   Collection Time: 02/09/23  2:01 PM  Result Value Ref Range   TSH 0.582 0.450 - 4.500 uIU/mL  T4, free     Status: None   Collection Time: 02/09/23  2:01 PM  Result Value Ref Range   Free T4 1.03 0.82 - 1.77 ng/dL   January 09, 2020 biopsy results are benign on bilateral thyroid nodules.   Assessment & Plan:   1. Nodular goiter  2.  Hypothyroidism  3.  Hyperlipidemia Her thyroid function tests are consistent with appropriate replacement.   She is advised to continue levothyroxine 75 mcg p.o. daily before breakfast s  - We discussed about the correct intake of her thyroid hormone, on empty stomach at fasting, with water, separated by at least 30 minutes from breakfast and other medications,  and separated by more than 4 hours from calcium, iron, multivitamins, acid reflux medications (PPIs). -Patient is made aware of the fact that thyroid hormone replacement is needed for life, dose to be adjusted by  periodic monitoring of thyroid function tests.   Regarding her multinodular goiter:  She is status post fine-needle aspiration of largest/concerning nodules on bilateral thyroid lobes with benign outcomes.  She will not need any definitive antithyroid intervention at this time.  -She will be considered for surveillance/repeat thyroid ultrasound before her next visit in 6 months.  In light of her hyperlipidemia, currently on atorvastatin, she is encouraged on the diet plan she is on which is helping her lose weight.  Whole Foods plant-based diet was emphasized.  She wishes to avoid statins, and reports that she is working on her lipid problems with her PCP.     - she is advised to maintain close follow up with Pcp, No for primary care needs.   I spent  21  minutes in the care of the patient today including review of labs from Thyroid Function, CMP, and other relevant labs ; imaging/biopsy records (current and previous including abstractions from other facilities); face-to-face time discussing  her lab results and symptoms, medications doses, her options of short and long term treatment based on the latest standards of care / guidelines;   and documenting the encounter.  [name]  participated in the discussions, expressed understanding, and voiced agreement with the above plans.  All questions were answered to her satisfaction. she is encouraged to contact clinic should she have any questions or concerns prior to her return visit.   Follow up plan: Return in about 6 months (around 08/15/2023) for F/U with Pre-visit Labs, Thyroid / Neck Ultrasound.   Marquis Lunch, MD [facility] [address] [place], [place] [address] Phone: [phone]  Fax: [phone]     02/14/2023, 11:44 AM  This note was partially dictated with voice recognition software. Similar sounding words can be transcribed inadequately or may not  be corrected upon  review.

## 2023-02-14 NOTE — Progress Notes (Signed)
Medical Nutrition Therapy  Appointment Start time:  1030  Appointment End time: 1100  Primary concerns today: Pre Diabetes  Referral diagnosis: R73.03 Preferred learning style: No Preference  Learning readiness: Ready    NUTRITION ASSESSMENT Follow up 57 yr old bfemale referred for pre diabetes.  A1C 6.0% from [place]. She notes she has been trying to eat healthier. Working on cutting out processed foods. Stays busy and reports not having time to eat meals and is forced to choose fast foods.  Still not able to eat three meals per day CKD eGFR is better at 52 ml/min. HGB/HCT-- 11.0/33.9. Transferrin low 223 mg/dl. Saw Dr. Fransico Him today for thyroid issues. TG 75 mg/dl--improved. Limited exercise due to spinal stenosis and back issues. Gained 4 lbs but she thinks it was related to fluid.  Anthropometrics  Wt Readings from Last 3 Encounters:  02/14/23 208 lb (94.3 kg)  02/14/23 208 lb (94.3 kg)  12/19/22 204 lb (92.5 kg)   Ht Readings from Last 3 Encounters:  02/14/23 5' 3.5" (1.613 m)  12/19/22 5' 3.5" (1.613 m)  08/12/22 5' 3.5" (1.613 m)   Body mass index is 36.27 kg/m. @BMIFA @ Facility age limit for growth %iles is 20 years. Facility age limit for growth %iles is 20 years.    Clinical Medical Hx: Multinodular thyroid, Hyperlipidemia, elevated BS. Medications: See chart Labs: Renal: Cret 1.32 mg/dl, eGFR 47 mg/dl. A1C 6.1% 12/22.  Lipid Panel     Component Value Date/Time   CHOL 215 (A) 07/05/2019 0000   TRIG 165 (A) 07/05/2019 0000   HDL 63 07/05/2019 0000   . Notable Signs/Symptoms: Weight gain,   Lifestyle & Dietary Hx LIves in a house with a friend.  Estimated daily fluid intake: 40 oz Supplements:  Sleep: 4-5 hrs Stress / self-care: None issues. Current average weekly physical activity: walk a little   24-Hr Dietary Recall First Meal:Oatmeal and [place] sausage, tea Snack: banana Second Meal: fish-fried, grapes, water Snack: grapes Third Meal:Chicken  pot pie, water  Snack: Beverages: water  Estimated Energy Needs Calories: 1200 Carbohydrate: 135g Protein: 90g Fat: 33g   NUTRITION DIAGNOSIS  NI-1.7 Predicted excessive energy intake As related to excess calorie and CHO intake.  As evidenced by A1C 6.1% and BMI 35 and only eating 2 meals per day with less activity.Marland Kitchen   NUTRITION INTERVENTION  Nutrition education (E-1) on the following topics:  Nutrition and Pre-Diabetes education provided on My Plate, CHO counting, meal planning, portion sizes, timing of meals, avoiding snacks between meals unless having a low blood sugar, target ranges for A1C and blood sugars, signs/symptoms and treatment of hyper/hypoglycemia, monitoring blood sugars, taking medications as prescribed, benefits of exercising 30 minutes per day and prevention of complications of DM.   Lifestyle Medicine  - Whole Food, Plant Predominant Nutrition is highly recommended: Eat Plenty of vegetables, Mushrooms, fruits, Legumes, Whole Grains, Nuts, seeds in lieu of processed meats, processed snacks/pastries red meat, poultry, eggs.    -It is better to avoid simple carbohydrates including: Cakes, Sweet Desserts, Ice Cream, Soda (diet and regular), Sweet Tea, Candies, Chips, Cookies, Store Bought Juices, Alcohol in Excess of  1-2 drinks a day, Lemonade,  Artificial Sweeteners, Doughnuts, Coffee Creamers, "Sugar-free" Products, etc, etc.  This is not a complete list.....  Exercise: If you are able: 30 -60 minutes a day ,4 days a week, or 150 minutes a week.  The longer the better.  Combine stretch, strength, and aerobic activities.  If you were told in the past  that you have high risk for cardiovascular diseases, you may seek evaluation by your heart doctor prior to initiating moderate to intense exercise programs.   Handouts Provided Include  Lifestyle Medicine Handouts  Learning Style & Readiness for Change Teaching method utilized: Visual & Auditory  Demonstrated degree of  understanding via: Teach Back  Barriers to learning/adherence to lifestyle change: Physical limitations with her back brace, knee brace and other medical conditions.  Goals Established by Pt GoalsGoals Keep working on eating balanced meals Keep drinking water Get A1C to below 5.7% Reduce eating out to 1 a week. Try to get to  [facility] for exercise weekly.   MONITORING & EVALUATION Dietary intake, weekly physical activity, and weight in 3 months.  Next Steps  Patient is to work on meal planning and eating more whole plant based foods. Marland Kitchen

## 2023-02-14 NOTE — Patient Instructions (Addendum)
Goals Keep working on eating balanced meals Keep drinking water Get A1C to below 5.7% Reduce eating out to 1 a week. Try to get to  [facility] for exercise weekly.

## 2023-04-07 ENCOUNTER — Ambulatory Visit (INDEPENDENT_AMBULATORY_CARE_PROVIDER_SITE_OTHER): Payer: No Typology Code available for payment source | Admitting: Allergy & Immunology

## 2023-04-07 ENCOUNTER — Encounter: Payer: Self-pay | Admitting: "Endocrinology

## 2023-04-07 ENCOUNTER — Other Ambulatory Visit: Payer: Self-pay

## 2023-04-07 ENCOUNTER — Encounter: Payer: Self-pay | Admitting: Allergy & Immunology

## 2023-04-07 VITALS — BP 108/70 | HR 76 | Temp 98.1°F | Resp 16 | Ht 63.5 in | Wt 209.6 lb

## 2023-04-07 DIAGNOSIS — J4540 Moderate persistent asthma, uncomplicated: Secondary | ICD-10-CM

## 2023-04-07 DIAGNOSIS — J310 Chronic rhinitis: Secondary | ICD-10-CM

## 2023-04-07 DIAGNOSIS — B999 Unspecified infectious disease: Secondary | ICD-10-CM | POA: Diagnosis not present

## 2023-04-07 MED ORDER — FLUTICASONE PROPIONATE 50 MCG/ACT NA SUSP: 1.0000 | Freq: Two times a day (BID) | NASAL | 5 refills | Status: DC

## 2023-04-07 MED ORDER — FEXOFENADINE HCL 180 MG PO TABS: 180.0000 mg | ORAL_TABLET | Freq: Every day | ORAL | 5 refills | Status: AC

## 2023-04-07 MED ORDER — BUDESONIDE-FORMOTEROL FUMARATE 160-4.5 MCG/ACT IN AERO: 2.0000 | INHALATION_SPRAY | Freq: Every day | RESPIRATORY_TRACT | 5 refills | Status: DC

## 2023-04-07 NOTE — Patient Instructions (Addendum)
1. Recurrent infections - multiple signs of pneumonia - Because of all of your infections, we are can a look at your immune system to make sure it is working properly.   - We will obtain some screening labs to evaluate your immune system.  - Labs to evaluate the quantitative [facility]) aspects of your immune system: IgG/IgA/IgM, CBC with differential - Labs to evaluate the qualitative (HOW WELL THEY WORK) aspects of your immune system: CH50, Pneumococcal titers, Tetanus titers, Diphtheria titers - We may consider immunizations with Pneumovax and Tdap to challenge your immune system, and then obtain repeat titers in 4-6 weeks.   2. Moderate persistent asthma, uncomplicated - Lung testing look great today. - It looks like you are supposed to be on Symbicort every day as your controller medication. - Let's try to use that at least once a day to keep inflammation in your lungs under control. - Daily controller medication(s): Symbicort 160/4.10mcg two puffs once daily - Prior to physical activity: albuterol 2 puffs 10-15 minutes before physical activity. - Rescue medications: albuterol 4 puffs every 4-6 hours as needed - Changes during respiratory infections or worsening symptoms: Increase Symbicort to 2 puffs twice daily for TWO WEEKS. - Asthma control goals:  * Full participation in all desired activities (may need albuterol before activity) * Albuterol use two time or less a week on average (not counting use with activity) * Cough interfering with sleep two time or less a month * Oral steroids no more than once a year * No hospitalizations  3. Chronic rhinitis - We we will get your records from your previous ENT appointment so we do not duplicate labs. - In the meantime, continue with Allegra (fexofenadine) 180 mg daily and Flonase (fluticasone) one spray per nostril twice daily.  - We may consider getting a sinus CT at some point as well. - We just need to put in the pieces together.  4.  Return in about 4 weeks (around 05/05/2023). You can have the follow up appointment with Dr. Dellis Anes or a Nurse Practicioner (our Nurse Practitioners are excellent and always have Physician oversight!).    Please inform us of any Emergency Department visits, hospitalizations, or changes in symptoms. Call us before going to the ED for breathing or allergy symptoms since we might be able to fit you in for a sick visit. Feel free to contact us anytime with any questions, problems, or concerns.  It was a pleasure to meet you today!  Websites that have reliable patient information: 1. American Academy of Asthma, Allergy, and Immunology: www.aaaai.org 2. Food Allergy Research and Education (FARE): foodallergy.org 3. Mothers of Asthmatics: http://www.asthmacommunitynetwork.org 4. American College of Allergy, Asthma, and Immunology: www.acaai.org      "Like" [place] on Facebook and Instagram for our latest updates!      A healthy democracy works best when [employer] participate! Make sure you are registered to vote! If you have moved or changed any of your contact information, you will need to get this updated before voting! Scan the QR codes below to learn more!

## 2023-04-07 NOTE — Progress Notes (Signed)
NEW PATIENT  Date of Service/Encounter:  04/07/23  Consult requested by: Agustina Caroli, MD   Assessment:   Recurrent infections - getting immune workup   Moderate persistent asthma, uncomplicated  Chronic rhinitis  Complicated past medication history, including fibromyalgia  Plan/Recommendations:   1. Recurrent infections - multiple signs of pneumonia - Because of all of your infections, we are can a look at your immune system to make sure it is working properly.   - We will obtain some screening labs to evaluate your immune system.  - Labs to evaluate the quantitative [facility]) aspects of your immune system: IgG/IgA/IgM, CBC with differential - Labs to evaluate the qualitative (HOW WELL THEY WORK) aspects of your immune system: CH50, Pneumococcal titers, Tetanus titers, Diphtheria titers - We may consider immunizations with Pneumovax and Tdap to challenge your immune system, and then obtain repeat titers in 4-6 weeks.   2. Moderate persistent asthma, uncomplicated - Lung testing look great today. - It looks like you are supposed to be on Symbicort every day as your controller medication. - Let's try to use that at least once a day to keep inflammation in your lungs under control. - Daily controller medication(s): Symbicort 160/4.33mcg two puffs once daily - Prior to physical activity: albuterol 2 puffs 10-15 minutes before physical activity. - Rescue medications: albuterol 4 puffs every 4-6 hours as needed - Changes during respiratory infections or worsening symptoms: Increase Symbicort to 2 puffs twice daily for TWO WEEKS. - Asthma control goals:  * Full participation in all desired activities (may need albuterol before activity) * Albuterol use two time or less a week on average (not counting use with activity) * Cough interfering with sleep two time or less a month * Oral steroids no more than once a year * No hospitalizations  3. Chronic rhinitis - We we will get  your records from your previous ENT appointment so we do not duplicate labs. - In the meantime, continue with Allegra (fexofenadine) 180 mg daily and Flonase (fluticasone) one spray per nostril twice daily.  - We may consider getting a sinus CT at some point as well. - We just need to put in the pieces together.  4. Return in about 4 weeks (around 05/05/2023). You can have the follow up appointment with Dr. Dellis Anes or a Nurse Practicioner (our Nurse Practitioners are excellent and always have Physician oversight!).    This note in its entirety was forwarded to the Provider who requested this consultation.  Subjective:   Margaret Gay is a 57 y.o. female presenting today for evaluation of  Chief Complaint  Patient presents with   Chronic sinusitis    C/o of frequent sinus infections. Causes acute bronchitis and pneumonia.   Establish Care    Margaret Gay has a history of the following: Patient Active Problem List   Diagnosis Date Noted   Mixed hyperlipidemia 08/12/2021   Multinodular thyroid 12/17/2019   Hypothyroidism 12/17/2019    History obtained from: chart review and patient.  Discussed the use of AI scribe software for clinical note transcription with the patient and/or guardian, who gave verbal consent to proceed.  Margaret Gay was referred by Agustina Caroli, MD.     Man is a 57 y.o. female presenting for an evaluation of multiple complaints .  Asthma/Respiratory Symptom History: Margaret Gay has a history of asthma, which is managed with albuterol and another unspecified inhaler. She reports needing to use her albuterol inhaler up to  four times a day recently, with triggers including acute bronchitis and suspected allergies. She has not been prescribed steroids for her breathing issues.  Allergic Rhinitis Symptom History: Margaret Gay reports that she has been having problems since the 1980s. Sinus symptoms in particular have not been responsive to multiple treatments. She  reports requiring antibiotics four to five times a year, with variable relief, for presumed sinusitis. This is typically doxycycline per the patient. She has previously seen an allergist, who did not identify any significant allergies. But this was in actuality an FNP at an ENT office. She never saw the physician at this particular office.  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 She denies any rashes or other skin issues.   The patient has a significant history of pneumonia, with seven episodes in the past. She denies any family history of similar recurrent illnesses. She has had blood work done at another clinic for allergy testing, but does not have the results. She also reports a recent infection affecting her nails.  Otherwise, there is no history of other atopic diseases, including drug allergies, stinging insect allergies, or contact dermatitis. There is no significant infectious history. Vaccinations are up to date.    Past Medical History: Patient Active Problem List   Diagnosis Date Noted   Mixed hyperlipidemia 08/12/2021   Multinodular thyroid 12/17/2019   Hypothyroidism 12/17/2019    Medication List:  Allergies as of 04/07/2023       Reactions   Compazine [prochlorperazine Edisylate]    Increase bp   Erythromycin Itching   Penicillins Swelling   Voltaren [diclofenac Sodium]    Gi upset        Medication List        Accurate as of April 07, 2023 12:09 PM. If you have any questions, ask your nurse or doctor.          STOP taking these medications    ciprofloxacin 500 MG tablet Commonly known as: CIPRO Stopped by: Tawnya Crook DM PO Stopped by: Alfonse Spruce       TAKE these medications    acetaminophen 650 MG CR tablet Commonly known as: TYLENOL Take 650 mg by mouth every 8 (eight) hours as needed for pain.   albuterol 108 (90 Base) MCG/ACT inhaler Commonly known as: VENTOLIN HFA Inhale 2 puffs  into the lungs every 6 (six) hours as needed.   amLODipine 5 MG tablet Commonly known as: NORVASC Take 5 mg by mouth daily.   aspirin EC 81 MG tablet Take 81 mg by mouth daily. Swallow whole.   Biofreeze Roll-On 4 % Gel Generic drug: Menthol (Topical Analgesic) Apply topically.   budesonide-formoterol 160-4.5 MCG/ACT inhaler Commonly known as: SYMBICORT Inhale 2 puffs into the lungs daily. What changed: when to take this Changed by: Alfonse Spruce   CALCIUM 500 PO Take 2 tablets by mouth daily.   calcium carbonate 500 MG chewable tablet Commonly known as: TUMS - dosed in mg elemental calcium Chew 1 tablet by mouth as needed for indigestion or heartburn.   carboxymethylcellulose 0.5 % Soln Commonly known as: REFRESH PLUS Place 1 drop into both eyes 3 (three) times daily as needed.   cholecalciferol 25 MCG (1000 UNIT) tablet Commonly known as: VITAMIN D3 Take 1,000 Units by mouth daily.   cyanocobalamin 500 MCG tablet Commonly known as: VITAMIN B12 Take 2 tablets by mouth daily.   cyclobenzaprine 5 MG tablet Commonly known as:  FLEXERIL Take 5 mg by mouth 3 (three) times daily.   cycloSPORINE 0.05 % ophthalmic emulsion Commonly known as: RESTASIS INSTILL 1 DROP IN EACH EYE EVERY 12 HOURS   famotidine 20 MG tablet Commonly known as: PEPCID Take 20 mg by mouth daily.   fexofenadine 180 MG tablet Commonly known as: ALLEGRA Take 1 tablet (180 mg total) by mouth daily.   fluticasone 50 MCG/ACT nasal spray Commonly known as: FLONASE Place 1 spray into both nostrils in the morning and at bedtime. Started by: Alfonse Spruce   furosemide 40 MG tablet Commonly known as: LASIX Take 40 mg by mouth daily.   levothyroxine 75 MCG tablet Commonly known as: SYNTHROID Take 1 tablet (75 mcg total) by mouth daily before breakfast.   lidocaine 5 % Commonly known as: LIDODERM 2 patches daily.   montelukast 10 MG tablet Commonly known as: SINGULAIR TAKE ONE  TABLET BY MOUTH DAILY FOR ALLERGIES AND ASTHMA- TAKE FOR 2 WEEKS   pravastatin 20 MG tablet Commonly known as: PRAVACHOL Take 20 mg by mouth daily.   sodium bicarbonate 650 MG tablet Take 650 mg by mouth 2 (two) times daily.   sodium chloride 0.65 % Soln nasal spray Commonly known as: OCEAN Place 1 spray into both nostrils as needed for congestion.   topiramate 100 MG tablet Commonly known as: TOPAMAX Take 100 mg by mouth 3 (three) times daily.   triamcinolone 55 MCG/ACT Aero nasal inhaler Commonly known as: NASACORT Place 2 sprays into the nose daily.        Birth History: non-contributory  Developmental History: non-contributory  Past Surgical History: Past Surgical History:  Procedure Laterality Date   ABDOMINAL HYSTERECTOMY     ANKLE ARTHROSCOPY     rt x2   ANKLE ARTHROSCOPY  09/07/2011   Procedure: ANKLE ARTHROSCOPY;  Surgeon: Sherri Rad, MD;  Location: [facility];  Service: Orthopedics;  Laterality: Right;  subtalar arthrotomy with tenosynovectomy with extensive debridement   APPENDECTOMY     OOPHORECTOMY     lt     Family History: Family History  Problem Relation Age of Onset   Asthma Mother    Cancer Mother    Allergic rhinitis Father    Hypertension Father    Heart attack Father       Social History: Deilyn lives at home with her family.  She lives in a house that is well to the 1950s.  There is gas heating and central cooling.  There is a cat inside of the home.  There are dust mite covers on the bed, but not the pillows.  There is no tobacco exposure.  He is not exposed to fumes, chemicals, or dust.  There is no vaping exposure.   Review of systems otherwise negative other than that mentioned in the HPI.    Objective:   Blood pressure 108/70, pulse 76, temperature 98.1 F (36.7 C), resp. rate 16, height 5' 3.5" (1.613 m), weight 209 lb 9.6 oz (95.1 kg), SpO2 98%. Body mass index is 36.55 kg/m.     Physical  Exam Vitals reviewed.  Constitutional:      Appearance: She is well-developed.     Comments: Talks slowly.   HENT:     Head: Normocephalic and atraumatic.     Right Ear: Tympanic membrane, ear canal and external ear normal. No drainage, swelling or tenderness. Tympanic membrane is not injected, scarred, erythematous, retracted or bulging.     Left Ear: Tympanic membrane, ear canal  and external ear normal. No drainage, swelling or tenderness. Tympanic membrane is not injected, scarred, erythematous, retracted or bulging.     Nose: No nasal deformity, septal deviation, mucosal edema or rhinorrhea.     Right Turbinates: Enlarged, swollen and pale.     Left Turbinates: Enlarged, swollen and pale.     Right Sinus: No maxillary sinus tenderness or frontal sinus tenderness.     Left Sinus: No maxillary sinus tenderness or frontal sinus tenderness.     Comments: No nasal polyps noted.     Mouth/Throat:     Mouth: Mucous membranes are not pale and not dry.     Pharynx: Uvula midline.  Eyes:     General:        Right eye: No discharge.        Left eye: No discharge.     Conjunctiva/sclera: Conjunctivae normal.     Right eye: Right conjunctiva is not injected. No chemosis.    Left eye: Left conjunctiva is not injected. No chemosis.    Pupils: Pupils are equal, round, and reactive to light.  Cardiovascular:     Rate and Rhythm: Normal rate and regular rhythm.     Heart sounds: Normal heart sounds.  Pulmonary:     Effort: Pulmonary effort is normal. No tachypnea, accessory muscle usage or respiratory distress.     Breath sounds: Normal breath sounds. No wheezing, rhonchi or rales.     Comments: Moving air well in all lung fields. No increased work of breathing noted.  Chest:     Chest wall: No tenderness.  Abdominal:     Tenderness: There is no abdominal tenderness. There is no guarding or rebound.  Lymphadenopathy:     Head:     Right side of head: No submandibular, tonsillar or occipital  adenopathy.     Left side of head: No submandibular, tonsillar or occipital adenopathy.     Cervical: No cervical adenopathy.  Skin:    Coloration: Skin is not pale.     Findings: No abrasion, erythema, petechiae or rash. Rash is not papular, urticarial or vesicular.  Neurological:     Mental Status: She is alert.  Psychiatric:        Behavior: Behavior is cooperative.      Diagnostic studies:    Spirometry: results normal (FEV1: 2.25/108%, FVC: 2.76/105%, FEV1/FVC: 82%).    Spirometry consistent with normal pattern.    Allergy Studies: labs sent instead          Malachi Bonds, MD [facility]

## 2023-04-12 NOTE — Progress Notes (Signed)
We did receive her records from her PCP.  She had a sinus CT performed in April 2024.  It showed mild polypoid mucosal thickening of the right maxillary sinus and minimal mucosal thickening of the left maxillary sinus.  She had major drainage pathways that were patent.  There was mild mucosal thickening of the nasal cavity.  She had environmental allergy testing via the blood since that was barely positive to dust mites as well as cockroach, mountain cedar, and cocklebur.  Alpha gal was negative with a total IgE of 73.  Results to be scanned into the system.

## 2023-04-21 LAB — CBC WITH DIFFERENTIAL/PLATELET
Basophils Absolute: 0 10*3/uL (ref 0.0–0.2)
Basos: 0 %
EOS (ABSOLUTE): 0.1 10*3/uL (ref 0.0–0.4)
Eos: 2 %
Hematocrit: 37.8 % (ref 34.0–46.6)
Hemoglobin: 12.2 g/dL (ref 11.1–15.9)
Immature Grans (Abs): 0 10*3/uL (ref 0.0–0.1)
Immature Granulocytes: 0 %
Lymphocytes Absolute: 0.8 10*3/uL (ref 0.7–3.1)
Lymphs: 22 %
MCH: 29 pg (ref 26.6–33.0)
MCHC: 32.3 g/dL (ref 31.5–35.7)
MCV: 90 fL (ref 79–97)
Monocytes Absolute: 0.3 10*3/uL (ref 0.1–0.9)
Monocytes: 8 %
Neutrophils Absolute: 2.6 10*3/uL (ref 1.4–7.0)
Neutrophils: 68 %
Platelets: 202 10*3/uL (ref 150–450)
RBC: 4.21 x10E6/uL (ref 3.77–5.28)
RDW: 14 % (ref 11.7–15.4)
WBC: 3.8 10*3/uL (ref 3.4–10.8)

## 2023-04-21 LAB — STREP PNEUMONIAE 23 SEROTYPES IGG
Pneumo Ab Type 1*: <0.1 ug/mL — ABNORMAL LOW (ref >1.3)
Pneumo Ab Type 12 (12F)*: <0.1 ug/mL — ABNORMAL LOW (ref >1.3)
Pneumo Ab Type 14*: 3.1 ug/mL (ref >1.3)
Pneumo Ab Type 17 (17F)*: 0.5 ug/mL — ABNORMAL LOW (ref >1.3)
Pneumo Ab Type 19 (19F)*: 2.2 ug/mL (ref >1.3)
Pneumo Ab Type 2*: <0.2 ug/mL — ABNORMAL LOW (ref >1.3)
Pneumo Ab Type 20*: 0.9 ug/mL — ABNORMAL LOW (ref >1.3)
Pneumo Ab Type 22 (22F)*: 0.3 ug/mL — ABNORMAL LOW (ref >1.3)
Pneumo Ab Type 23 (23F)*: 0.3 ug/mL — ABNORMAL LOW (ref >1.3)
Pneumo Ab Type 26 (6B)*: <0.1 ug/mL — ABNORMAL LOW (ref >1.3)
Pneumo Ab Type 3*: <0.1 ug/mL — ABNORMAL LOW (ref >1.3)
Pneumo Ab Type 34 (10A)*: 1.6 ug/mL (ref >1.3)
Pneumo Ab Type 4*: <0.1 ug/mL — ABNORMAL LOW (ref >1.3)
Pneumo Ab Type 43 (11A)*: 0.3 ug/mL — ABNORMAL LOW (ref >1.3)
Pneumo Ab Type 5*: 7 ug/mL (ref >1.3)
Pneumo Ab Type 51 (7F)*: 1.2 ug/mL — ABNORMAL LOW (ref >1.3)
Pneumo Ab Type 54 (15B)*: 0.3 ug/mL — ABNORMAL LOW (ref >1.3)
Pneumo Ab Type 56 (18C)*: 0.2 ug/mL — ABNORMAL LOW (ref >1.3)
Pneumo Ab Type 57 (19A)*: 3.4 ug/mL (ref >1.3)
Pneumo Ab Type 68 (9V)*: <0.1 ug/mL — ABNORMAL LOW (ref >1.3)
Pneumo Ab Type 70 (33F)*: 7.1 ug/mL (ref >1.3)
Pneumo Ab Type 8*: 15.6 ug/mL (ref >1.3)
Pneumo Ab Type 9 (9N)*: <0.1 ug/mL — ABNORMAL LOW (ref >1.3)

## 2023-04-21 LAB — IGG, IGA, IGM
IgA/Immunoglobulin A, Serum: 237 mg/dL (ref 87–352)
IgG (Immunoglobin G), Serum: 1269 mg/dL (ref 586–1602)
IgM (Immunoglobulin M), Srm: 41 mg/dL (ref 26–217)

## 2023-04-21 LAB — COMPLEMENT, TOTAL: Compl, Total (CH50): >60 U/mL (ref >41)

## 2023-04-21 LAB — DIPHTHERIA / TETANUS ANTIBODY PANEL
Diphtheria Ab: 0.68 [IU]/mL (ref <0.10)
Tetanus Ab, IgG: 2.91 [IU]/mL (ref <0.10)

## 2023-05-01 ENCOUNTER — Encounter: Payer: Self-pay | Admitting: Allergy & Immunology

## 2023-05-10 ENCOUNTER — Encounter: Payer: Self-pay | Admitting: Allergy & Immunology

## 2023-05-10 ENCOUNTER — Ambulatory Visit (INDEPENDENT_AMBULATORY_CARE_PROVIDER_SITE_OTHER): Payer: No Typology Code available for payment source | Admitting: Allergy & Immunology

## 2023-05-10 VITALS — BP 110/66 | HR 101 | Temp 97.4°F | Resp 16 | Wt 208.0 lb

## 2023-05-10 DIAGNOSIS — J0101 Acute recurrent maxillary sinusitis: Secondary | ICD-10-CM | POA: Diagnosis not present

## 2023-05-10 DIAGNOSIS — D806 Antibody deficiency with near-normal immunoglobulins or with hyperimmunoglobulinemia: Secondary | ICD-10-CM | POA: Diagnosis not present

## 2023-05-10 DIAGNOSIS — J3089 Other allergic rhinitis: Secondary | ICD-10-CM

## 2023-05-10 DIAGNOSIS — J4540 Moderate persistent asthma, uncomplicated: Secondary | ICD-10-CM | POA: Diagnosis not present

## 2023-05-10 DIAGNOSIS — J302 Other seasonal allergic rhinitis: Secondary | ICD-10-CM

## 2023-05-10 MED ORDER — DOXYCYCLINE HYCLATE 100 MG PO TABS: 100.0000 mg | ORAL_TABLET | Freq: Two times a day (BID) | ORAL | 0 refills | Status: AC

## 2023-05-10 NOTE — Patient Instructions (Addendum)
 1. 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 - I do need the date of your last pneumonia vaccine when you are able to do that.  - This will help determine whether or not we can start immunoglobulin replacement or prophylactic (preventative) antibiotics.  - Prophylactic antibiotics are taken daily to prevent infections. - Immunoglobulin replacement, which are infusions (weekly, biweekly, or monthly) to boost your immune system and prevent you from getting infections.  - Learning more about specific antibody deficiency here: https://primaryimmune.org/understanding-primary-immunodeficiency/types-of-pi/specific-antibody-deficiency  2. Acute sinusitis  - COVID testing was NEGATIVE today.  - Start doxycycline  100mg  twice daily for ten days. - Use Mucinex to help break up the mucous and allow sinus drainage.   3. Moderate persistent asthma, uncomplicated - Lung testing look great today. - You can use Symbicort  twice daily EVERY DAY.  - Daily controller medication(s): Symbicort  160/4.107mcg two puffs twice daily with spacer - Prior to physical activity: albuterol  2 puffs 10-15 minutes before physical activity. - Rescue medications: albuterol  4 puffs every 4-6 hours as needed - Changes during respiratory infections or worsening symptoms: Increase Symbicort  to 2 puffs twice daily for TWO WEEKS. - Asthma control goals:  * Full participation in all desired activities (may need albuterol  before activity) * Albuterol  use two time or less a week on average (not counting use with activity) * Cough interfering with sleep two time or less a month * Oral steroids no more than once a year * No hospitalizations  4. Chronic rhinitis (dust mites, cockroach, mountain cedar, cocklebur) - We will schedule you for allergy  testing at the next visit to complete this workup. - In the meantime, continue with Allegra  (fexofenadine ) 180 mg  daily and Nasacort one spray per nostril twice daily. - Stop the Allegra  for three days before the next appointment.   5. Return in about 4 weeks (around 06/07/2023) for INTRADERMAL TESTING. You can have the follow up appointment with Dr. Iva or a Nurse Practicioner (our Nurse Practitioners are excellent and always have Physician oversight!).    Please inform us  of any Emergency Department visits, hospitalizations, or changes in symptoms. Call us  before going to the ED for breathing or allergy  symptoms since we might be able to fit you in for a sick visit. Feel free to contact us  anytime with any questions, problems, or concerns.  It was a pleasure to see you again today!  Websites that have reliable patient information: 1. American Academy of Asthma, Allergy , and Immunology: www.aaaai.org 2. Food Allergy  Research and Education (FARE): foodallergy.org 3. Mothers of Asthmatics: http://www.asthmacommunitynetwork.org 4. American College of Allergy , Asthma, and Immunology: www.acaai.org      "Like" us  on Facebook and Instagram for our latest updates!      A healthy democracy works best when [employer] participate! Make sure you are registered to vote! If you have moved or changed any of your contact information, you will need to get this updated before voting! Scan the QR codes below to learn more!

## 2023-05-10 NOTE — Progress Notes (Signed)
 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 - I do need the date of your last pneumonia vaccine when you are able to do that.  - This will help determine whether or not we can start immunoglobulin replacement or prophylactic (preventative) antibiotics.  - Prophylactic antibiotics are taken daily to prevent infections. - Immunoglobulin replacement, which are infusions (weekly, biweekly, or monthly) to boost your immune system and prevent you from getting infections.  - Learning more about specific antibody deficiency here: https://primaryimmune.org/understanding-primary-immunodeficiency/types-of-pi/specific-antibody-deficiency  2. Acute sinusitis  - COVID testing was NEGATIVE today.  - Start doxycycline  100mg  twice daily for ten days. - Use Mucinex to help break up the mucous and allow sinus drainage.   3. Moderate persistent asthma, uncomplicated - Lung testing look great today. - You can use Symbicort  twice daily EVERY DAY.  - Daily controller medication(s): Symbicort  160/4.85mcg two puffs twice daily with spacer - Prior to physical activity: albuterol  2 puffs 10-15 minutes before physical activity. - Rescue medications: albuterol  4 puffs every 4-6 hours as needed - Changes during respiratory infections or worsening symptoms: Increase Symbicort  to 2 puffs twice daily for TWO WEEKS. - Asthma  control goals:  * Full participation in all desired activities (may need albuterol  before activity) * Albuterol  use two time or less a week on average (not counting use with activity) * Cough interfering with sleep two time or less a month * Oral steroids no more than once a year * No hospitalizations  4.  Perennial and seasonal allergic rhinitis (dust mites, cockroach, mountain cedar, cocklebur) - We will schedule you for allergy  testing at the next visit to complete this workup. - In the meantime, continue with Allegra  (fexofenadine ) 180 mg daily and Nasacort one spray per nostril twice daily. - Stop the Allegra  for three days before the next appointment.   5. Return in about 4 weeks (around 06/07/2023) for INTRADERMAL TESTING. You can have the follow up appointment with Dr. Iva or a Nurse Practicioner (our Nurse Practitioners are excellent and always have Physician oversight!).   Subjective:   Margaret Gay is a 58 y.o. female presenting today for follow up of  Chief Complaint  Patient presents with   Follow-up    Has a sinus infection that is developing sneezing, coughing,congestion, runny nose. Switched from flonase  to nasacort since flonase  was causing nose bleeds.     Margaret Gay has a history of the following: Patient Active Problem List   Diagnosis Date Noted   Mixed hyperlipidemia 08/12/2021   Multinodular thyroid  12/17/2019   Hypothyroidism 12/17/2019    History obtained from: chart review and patient.  Discussed the use of AI scribe software for clinical note transcription with the patient and/or guardian, who gave verbal consent to proceed.  Margaret Gay is a 58 y.o. female presenting for a follow up visit.  We last saw her in December 2024.  At that time, we obtained an immune workup because of her recurrent episodes of pneumonia.  Her  lung testing looked great.  We continue with Symbicort  160 mcg 2 puffs once daily as well as albuterol  as needed.  For her rhinitis, we  did not do any repeat testing at the time because she recently had labs done.  We continue with Allegra  and Flonase  and talked about doing another sinus CT at some point.  Since last visit, she has done very well.  She reports a recent increase in sinus pressure, sneezing, coughing, and a change in the color of her nasal discharge. These symptoms have been worsening over the past few days. Her current sinus infection symptoms have been affecting her breathing, which is typically managed with her asthma medication. She has not had any recent COVID testing. She reports wearing a mask in most public settings, except at church.  Asthma/Respiratory Symptom History: Regarding her asthma, the patient is currently on Symbicort , which she takes two puffs of in the morning. She reports that this medication helps with her breathing, particularly in the morning.  She is wondering if she can take it at night.  She has not been using her rescue medication.  She has not been on prednisone .  She has been hospitalized with pneumonia on a few occasions. Since then, she has had chronic issues with bronchitis and pneumonia.  Allergic Rhinitis Symptom History: The patient has a history of allergies, with positive reactions to dust mites, cockroach, mountain cedar, and cocklebur. She is currently taking Allegra  for these allergies.  She is open to doing additional testing to complete this workup.  She has never been on allergy  shots.  She was not protective to many streptococcal pneumonia strains at her first visit. The patient has a history of recurrent pneumonia and has received pneumonia vaccines in the past, although the exact timeline is unclear.  [facility].  She has never been on immunoglobulin replacement.  She has never been on prophylactic antibiotics.  The patient has been attending physical therapy twice a week and reports feeling stronger. She also mentions  improvement in her nail health with the use of a new solution.   She spent her holidays alone.  Her parents both passed away recently in the last 5 years.  Otherwise, there have been no changes to her past medical history, surgical history, family history, or social history.    Review of systems otherwise negative other than that mentioned in the HPI.    Objective:   Blood pressure 110/66, pulse (!) 101, temperature (!) 97.4 F (36.3 C), resp. rate 16, weight 208 lb (94.3 kg), SpO2 98%. Body mass index is 36.27 kg/m.    Physical Exam Vitals reviewed.  Constitutional:      Appearance: She is well-developed.     Comments: Talks slowly.  Very lovely.  Talkative.  HENT:     Head: Normocephalic and atraumatic.     Right Ear: Tympanic membrane, ear canal and external ear normal. No drainage, swelling or tenderness. Tympanic membrane is not injected, scarred, erythematous, retracted or bulging.     Left Ear: Tympanic membrane, ear canal and external ear normal. No drainage, swelling or tenderness. Tympanic membrane is not injected, scarred, erythematous, retracted or bulging.     Nose: No nasal deformity, septal deviation, mucosal edema or rhinorrhea.     Right Turbinates: Enlarged, swollen and pale.     Left Turbinates: Enlarged, swollen and pale.     Right Sinus: Maxillary sinus tenderness present. No  frontal sinus tenderness.     Left Sinus: Maxillary sinus tenderness present. No frontal sinus tenderness.     Comments: Purulent discharge.    Mouth/Throat:     Mouth: Mucous membranes are not pale and not dry.     Pharynx: Uvula midline.  Eyes:     General:        Right eye: No discharge.        Left eye: No discharge.     Conjunctiva/sclera: Conjunctivae normal.     Right eye: Right conjunctiva is not injected. No chemosis.    Left eye: Left conjunctiva is not injected. No chemosis.    Pupils: Pupils are equal, round, and reactive to light.  Cardiovascular:     Rate and  Rhythm: Normal rate and regular rhythm.     Heart sounds: Normal heart sounds.  Pulmonary:     Effort: Pulmonary effort is normal. No tachypnea, accessory muscle usage or respiratory distress.     Breath sounds: Normal breath sounds. No wheezing, rhonchi or rales.     Comments: Moving air well in all lung fields. No increased work of breathing noted.  Chest:     Chest wall: No tenderness.  Abdominal:     Tenderness: There is no abdominal tenderness. There is no guarding or rebound.  Lymphadenopathy:     Head:     Right side of head: No submandibular, tonsillar or occipital adenopathy.     Left side of head: No submandibular, tonsillar or occipital adenopathy.     Cervical: No cervical adenopathy.  Skin:    Coloration: Skin is not pale.     Findings: No abrasion, erythema, petechiae or rash. Rash is not papular, urticarial or vesicular.  Neurological:     Mental Status: She is alert.  Psychiatric:        Behavior: Behavior is cooperative.      Diagnostic studies:    Spirometry: results normal (FEV1: 1.72/83%, FVC: 2.38/91%, FEV1/FVC: 72%).    Spirometry consistent with normal pattern.   COVID rapid swab was negative with adequate control      Marty Shaggy, MD  [facility] 

## 2023-06-07 ENCOUNTER — Encounter: Payer: Self-pay | Admitting: Allergy & Immunology

## 2023-06-07 ENCOUNTER — Ambulatory Visit (INDEPENDENT_AMBULATORY_CARE_PROVIDER_SITE_OTHER): Payer: No Typology Code available for payment source | Admitting: Allergy & Immunology

## 2023-06-07 DIAGNOSIS — J4540 Moderate persistent asthma, uncomplicated: Secondary | ICD-10-CM | POA: Diagnosis not present

## 2023-06-07 DIAGNOSIS — J302 Other seasonal allergic rhinitis: Secondary | ICD-10-CM

## 2023-06-07 DIAGNOSIS — J3089 Other allergic rhinitis: Secondary | ICD-10-CM | POA: Diagnosis not present

## 2023-06-07 DIAGNOSIS — D806 Antibody deficiency with near-normal immunoglobulins or with hyperimmunoglobulinemia: Secondary | ICD-10-CM

## 2023-06-07 NOTE — Patient Instructions (Addendum)
 1. 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 - I do need the date of your last pneumonia vaccine when you are able to do that.  - Please try to call and get that information so we can move forward with next steps.  - This will help determine whether or not we can start immunoglobulin replacement or prophylactic (preventative) antibiotics.  - Prophylactic antibiotics are taken daily to prevent infections. - Immunoglobulin replacement consists of infusions (weekly, biweekly, or monthly) to boost your immune system and prevent you from getting infections.  - Learning more about specific antibody deficiency here: https://primaryimmune.org/understanding-primary-immunodeficiency/types-of-pi/specific-antibody-deficiency  2. Moderate persistent asthma, uncomplicated - Lung testing not done today.  - You can use Symbicort  twice daily EVERY DAY.  - Daily controller medication(s): Symbicort  160/4.2mcg two puffs twice daily with spacer - Prior to physical activity: albuterol  2 puffs 10-15 minutes before physical activity. - Rescue medications: albuterol  4 puffs every 4-6 hours as needed - Changes during respiratory infections or worsening symptoms: Increase Symbicort  to 2 puffs twice daily for TWO WEEKS. - Asthma control goals:  * Full participation in all desired activities (may need albuterol  before activity) * Albuterol  use two time or less a week on average (not counting use with activity) * Cough interfering with sleep two time or less a month * Oral steroids no more than once a year * No hospitalizations  3. Chronic rhinitis (dust mites, cockroach, mountain cedar, ragweed, weeds) - Testing today was positive only to RAGWEED. - Copy of testing results provided. - Information on allergy  shots provided (consider RUSH immunotherapy).  - Continue with Allegra  (fexofenadine ) 180 mg daily. - Continue with Nasacort one  spray per nostril twice daily.  4. Return in about 2 months (around 08/05/2023). You can have the follow up appointment with Dr. Iva or a Nurse Practicioner (our Nurse Practitioners are excellent and always have Physician oversight!).    Please inform us  of any Emergency Department visits, hospitalizations, or changes in symptoms. Call us  before going to the ED for breathing or allergy  symptoms since we might be able to fit you in for a sick visit. Feel free to contact us  anytime with any questions, problems, or concerns.  It was a pleasure to see you again today!  Websites that have reliable patient information: 1. American Academy of Asthma, Allergy , and Immunology: www.aaaai.org 2. Food Allergy  Research and Education (FARE): foodallergy.org 3. Mothers of Asthmatics: http://www.asthmacommunitynetwork.org 4. [employer] of Allergy , Asthma, and Immunology: www.acaai.org      "Like" us  on Facebook and Instagram for our latest updates!      A healthy democracy works best when [employer] participate! Make sure you are registered to vote! If you have moved or changed any of your contact information, you will need to get this updated before voting! Scan the QR codes below to learn more!       Intradermal - 06/07/23 0850     Time Antigen Placed 0850    Allergen Manufacturer Jestine    Location Arm    Number of Test 14    Control Negative    Bahia Negative    Bermuda Negative    Johnson Negative    7 Grass Negative    Ragweed Mix 1+    Weed Mix Negative    Tree Mix Negative    Mold 1 Negative    Mold 2 Negative    Mold 3 Negative  Mold 4 Negative    Cat Negative    Dog Negative             Reducing Pollen Exposure  The American Academy of Allergy , Asthma and Immunology suggests the following steps to reduce your exposure to pollen during allergy  seasons.    Do not hang sheets or clothing out to dry; pollen may collect on these items. Do not mow lawns or  spend time around freshly cut grass; mowing stirs up pollen. Keep windows closed at night.  Keep car windows closed while driving. Minimize morning activities outdoors, a time when pollen counts are usually at their highest. Stay indoors as much as possible when pollen counts or humidity is high and on windy days when pollen tends to remain in the air longer. Use air conditioning when possible.  Many air conditioners have filters that trap the pollen spores. Use a HEPA room air filter to remove pollen form the indoor air you breathe.  Control of Dust Mite Allergen    Dust mites play a major role in allergic asthma and rhinitis.  They occur in environments with high humidity wherever human skin is found.  Dust mites absorb humidity from the atmosphere (ie, they do not drink) and feed on organic matter (including shed human and animal skin).  Dust mites are a microscopic type of insect that you cannot see with the naked eye.  High levels of dust mites have been detected from mattresses, pillows, carpets, upholstered furniture, bed covers, clothes, soft toys and any woven material.  The principal allergen of the dust mite is found in its feces.  A gram of dust may contain 1,000 mites and 250,000 fecal particles.  Mite antigen is easily measured in the air during house cleaning activities.  Dust mites do not bite and do not cause harm to humans, other than by triggering allergies/asthma.    Ways to decrease your exposure to dust mites in your home:  Encase mattresses, box springs and pillows with a mite-impermeable barrier or cover   Wash sheets, blankets and drapes weekly in hot water (130 F) with detergent and dry them in a dryer on the hot setting.  Have the room cleaned frequently with a vacuum cleaner and a damp dust-mop.  For carpeting or rugs, vacuuming with a vacuum cleaner equipped with a high-efficiency particulate air (HEPA) filter.  The dust mite allergic individual should not be in a room  which is being cleaned and should wait 1 hour after cleaning before going into the room. Do not sleep on upholstered furniture (eg, couches).   If possible removing carpeting, upholstered furniture and drapery from the home is ideal.  Horizontal blinds should be eliminated in the rooms where the person spends the most time (bedroom, study, television room).  Washable vinyl, roller-type shades are optimal. Remove all non-washable stuffed toys from the bedroom.  Wash stuffed toys weekly like sheets and blankets above.   Reduce indoor humidity to less than 50%.  Inexpensive humidity monitors can be purchased at most hardware stores.  Do not use a humidifier as can make the problem worse and are not recommended.  Control of Cockroach Allergen  Cockroach allergen has been identified as an important cause of acute attacks of asthma, especially in urban settings.  There are fifty-five species of cockroach that exist in the [place] , however only three, the American, German and Oriental species produce allergen that can affect patients with Asthma.  Allergens can be obtained from fecal  particles, egg casings and secretions from cockroaches.    Remove food sources. Reduce access to water. Seal access and entry points. Spray runways with 0.5-1% Diazinon or Chlorpyrifos Blow boric acid power under stoves and refrigerator. Place bait stations (hydramethylnon) at feeding sites.  Allergy  Shots  Allergies are the result of a chain reaction that starts in the immune system. Your immune system controls how your body defends itself. For instance, if you have an allergy  to pollen, your immune system identifies pollen as an invader or allergen. Your immune system overreacts by producing antibodies called Immunoglobulin E (IgE). These antibodies travel to cells that release chemicals, causing an allergic reaction.  The concept behind allergy  immunotherapy, whether it is received in the form of shots or tablets,  is that the immune system can be desensitized to specific allergens that trigger allergy  symptoms. Although it requires time and patience, the payback can be long-term relief. Allergy  injections contain a dilute solution of those substances that you are allergic to based upon your skin testing and allergy  history.   How Do Allergy  Shots Work?  Allergy  shots work much like a vaccine. Your body responds to injected amounts of a particular allergen given in increasing doses, eventually developing a resistance and tolerance to it. Allergy  shots can lead to decreased, minimal or no allergy  symptoms.  There generally are two phases: build-up and maintenance. Build-up often ranges from three to six months and involves receiving injections with increasing amounts of the allergens. The shots are typically given once or twice a week, though more rapid build-up schedules are sometimes used.  The maintenance phase begins when the most effective dose is reached. This dose is different for each person, depending on how allergic you are and your response to the build-up injections. Once the maintenance dose is reached, there are longer periods between injections, typically two to four weeks.  Occasionally doctors give cortisone-type shots that can temporarily reduce allergy  symptoms. These types of shots are different and should not be confused with allergy  immunotherapy shots.  Who Can Be Treated with Allergy  Shots?  Allergy  shots may be a good treatment approach for people with allergic rhinitis (hay fever), allergic asthma, conjunctivitis (eye allergy ) or stinging insect allergy .   Before deciding to begin allergy  shots, you should consider:   The length of allergy  season and the severity of your symptoms  Whether medications and/or changes to your environment can control your symptoms  Your desire to avoid long-term medication use  Time: allergy  immunotherapy requires a major time commitment  Cost: may  vary depending on your insurance coverage  Allergy  shots for children age 80 and older are effective and often well tolerated. They might prevent the onset of new allergen sensitivities or the progression to asthma.  Allergy  shots are not started on patients who are pregnant but can be continued on patients who become pregnant while receiving them. In some patients with other medical conditions or who take certain common medications, allergy  shots may be of risk. It is important to mention other medications you talk to your allergist.   What are the two types of build-ups offered:   RUSH or Rapid Desensitization -- one day of injections lasting from 8:30-4:30pm, injections every 1 hour.  Approximately half of the build-up process is completed in that one day.  The following week, normal build-up is resumed, and this entails ~16 visits either weekly or twice weekly, until reaching your "maintenance dose" which is continued weekly until eventually getting spaced  out to every month for a duration of 3 to 5 years. The regular build-up appointments are nurse visits where the injections are administered, followed by required monitoring for 30 minutes.    Traditional build-up -- weekly visits for 6 -12 months until reaching "maintenance dose", then continue weekly until eventually spacing out to every 4 weeks as above. At these appointments, the injections are administered, followed by required monitoring for 30 minutes.     Either way is acceptable, and both are equally effective. With the rush protocol, the advantage is that less time is spent here for injections overall AND you would also reach maintenance dosing faster (which is when the clinical benefit starts to become more apparent). Not everyone is a candidate for rapid desensitization.   IF we proceed with the RUSH protocol, there are premedications which must be taken the day before and the day after the rush only (this includes antihistamines,  steroids, and Singulair ).  After the rush day, no prednisone  or Singulair  is required, and we just recommend antihistamines taken on your injection day.  What Is An Estimate of the Costs?  If you are interested in starting allergy  injections, please check with your insurance company about your coverage for both allergy  vial sets and allergy  injections.  Please do so prior to making the appointment to start injections.  The following are CPT codes to give to your insurance company. These are the amounts we BILL to the insurance company, but the amount YOU WILL PAY and WE RECEIVE IS SUBSTANTIALLY LESS and depends on the contracts we have with different insurance companies.   Amount Billed to Insurance One allergy  vial set  CPT 95165   $ 1200     Two allergy  vial set  CPT 95165   $ 2400     Three allergy  vial set  CPT 95165   $ 3600     One injection   CPT 95115   $ 35  Two injections   CPT 95117   $ 40 RUSH (Rapid Desensitization) CPT 95180 x 8 hours $500/hour  Regarding the allergy  injections, your co-pay may or may not apply with each injection, so please confirm this with your insurance company. When you start allergy  injections, 1 or 2 sets of vials are made based on your allergies.  Not all patients can be on one set of vials. A set of vials lasts 6 months to a year depending on how quickly you can proceed with your build-up of your allergy  injections. Vials are personalized for each patient depending on their specific allergens.  How often are allergy  injection given during the build-up period?   Injections are given at least weekly during the build-up period until your maintenance dose is achieved. Per the doctor's discretion, you may have the option of getting allergy  injections two times per week during the build-up period. However, there must be at least 48 hours between injections. The build-up period is usually completed within 6-12 months depending on your ability to schedule injections  and for adjustments for reactions. When maintenance dose is reached, your injection schedule is gradually changed to every two weeks and later to every three weeks. Injections will then continue every 4 weeks. Usually, injections are continued for a total of 3-5 years.   When Will I Feel Better?  Some may experience decreased allergy  symptoms during the build-up phase. For others, it may take as long as 12 months on the maintenance dose. If there is no improvement after  a year of maintenance, your allergist will discuss other treatment options with you.  If you aren't responding to allergy  shots, it may be because there is not enough dose of the allergen in your vaccine or there are missing allergens that were not identified during your allergy  testing. Other reasons could be that there are high levels of the allergen in your environment or major exposure to non-allergic triggers like tobacco smoke.  What Is the Length of Treatment?  Once the maintenance dose is reached, allergy  shots are generally continued for three to five years. The decision to stop should be discussed with your allergist at that time. Some people may experience a permanent reduction of allergy  symptoms. Others may relapse and a longer course of allergy  shots can be considered.  What Are the Possible Reactions?  The two types of adverse reactions that can occur with allergy  shots are local and systemic. Common local reactions include very mild redness and swelling at the injection site, which can happen immediately or several hours after. Report a delayed reaction from your last injection. These include arm swelling or runny nose, watery eyes or cough that occurs within 12-24 hours after injection. A systemic reaction, which is less common, affects the entire body or a particular body system. They are usually mild and typically respond quickly to medications. Signs include increased allergy  symptoms such as sneezing, a stuffy nose  or hives.   Rarely, a serious systemic reaction called anaphylaxis can develop. Symptoms include swelling in the throat, wheezing, a feeling of tightness in the chest, nausea or dizziness. Most serious systemic reactions develop within 30 minutes of allergy  shots. This is why it is strongly recommended you wait in your doctor's office for 30 minutes after your injections. Your allergist is trained to watch for reactions, and his or her staff is trained and equipped with the proper medications to identify and treat them.   Report to the nurse immediately if you experience any of the following symptoms: swelling, itching or redness of the skin, hives, watery eyes/nose, breathing difficulty, excessive sneezing, coughing, stomach pain, diarrhea, or light headedness. These symptoms may occur within 15-20 minutes after injection and may require medication.   Who Should Administer Allergy  Shots?  The preferred location for receiving shots is your prescribing allergist's office. Injections can sometimes be given at another facility where the physician and staff are trained to recognize and treat reactions, and have received instructions by your prescribing allergist.  What if I am late for an injection?   Injection dose will be adjusted depending upon how many days or weeks you are late for your injection.   What if I am sick?   Please report any illness to the nurse before receiving injections. She may adjust your dose or postpone injections depending on your symptoms. If you have fever, flu, sinus infection or chest congestion it is best to postpone allergy  injections until you are better. Never get an allergy  injection if your asthma is causing you problems. If your symptoms persist, seek out medical care to get your health problem under control.  What If I am or Become Pregnant:  Women that become pregnant should schedule an appointment with [facility] before receiving any further  allergy  injections.

## 2023-06-07 NOTE — Progress Notes (Signed)
 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 - I do need the date of your last pneumonia vaccine when you are able to do that.  - Please try to call and get that information so we can move forward with next steps.  - This will help determine whether or not we can start immunoglobulin replacement or prophylactic (preventative) antibiotics.  - Prophylactic antibiotics are taken daily to prevent infections. - Immunoglobulin replacement consists of infusions (weekly, biweekly, or monthly) to boost your immune system and prevent you from getting infections.  - Learning more about specific antibody deficiency here: https://primaryimmune.org/understanding-primary-immunodeficiency/types-of-pi/specific-antibody-deficiency  2. Moderate persistent asthma, uncomplicated - Lung testing not done today.  - You can use Symbicort  twice daily EVERY DAY.  - Daily controller medication(s): Symbicort  160/4.42mcg two puffs twice daily with spacer - Prior to physical activity: albuterol  2 puffs 10-15 minutes before physical activity. - Rescue medications: albuterol  4 puffs every 4-6 hours as needed - Changes during respiratory infections or worsening symptoms: Increase Symbicort  to 2 puffs twice daily for TWO WEEKS. - Asthma control goals:  * Full participation in all desired activities (may need  albuterol  before activity) * Albuterol  use two time or less a week on average (not counting use with activity) * Cough interfering with sleep two time or less a month * Oral steroids no more than once a year * No hospitalizations  3. Chronic rhinitis (dust mites, cockroach, mountain cedar, ragweed, weeds) - Testing today was positive only to RAGWEED. - Copy of testing results provided. - Information on allergy  shots provided (consider [facility]USH immunotherapy).  - Continue with Allegra  (fexofenadine ) 180 mg daily. - Continue with Nasacort one spray per nostril twice daily.  4. Return in about 2 months (around 08/05/2023). You can have the follow up appointment with Dr. Iva or a Nurse Practicioner (our Nurse Practitioners are excellent and always have Physician oversight!).   Subjective:   Margaret Gay is a 58 y.o. female presenting today for follow up of No chief complaint on file.   Margaret Gay has a history of the following: Patient Active Problem List   Diagnosis Date Noted   Mixed hyperlipidemia 08/12/2021   Multinodular thyroid  12/17/2019   Hypothyroidism 12/17/2019    History obtained from: chart review and patient.  Discussed the use of AI scribe software for clinical note transcription with the patient and/or guardian, who gave verbal consent to proceed.  Margaret Gay is a 58 y.o. female presenting for skin testing.  She was last seen in January 2025.  At that time, she was only protective to 7 out of 23 serotypes of Streptococcus pneumonia.  She was going to let us  know when her last pneumonia vaccine was.  She received it at the [facility].  We discussed these implications including prophylactic antibiotics versus immunoglobulin replacement.  She did have acute sinusitis at the time.  COVID testing was negative.  We started her on doxycycline  100 mg twice a  day for 10 days since her symptoms have been going on for a couple of weeks.  Her lung testing look great.  We continue with Symbicort   160 mcg 2 puffs twice daily and albuterol  as needed.  For her rhinitis, she already had blood work that was positive to dust mites, cockroach, cedar, and cocklebur.  We decided to complete this workup with intradermal testing to things to which she tested negative on blood work.  We continue with Allegra  and Nasacort.  Since last visit, she has done fairly well.  She is off her Allegra  today anticipation of testing.  She does share that she was recently diagnosed with bronchitis and was started on prednisone .  She continues to have a lot of of productive cough with mucopurulent sputum.  She was not placed on antibiotics.  She deftly feels better than before she started the prednisone .  She is very motivated to start medication.  We did spend some time discussing that today.  She is not sure when her last pneumonia shot was.  She is going to call the [facility] again to check on this.  We discussed prophylactic antibiotics versus immunoglobulin replacement at the last visit.  She does think she might need to pursue 1 of these options.  Otherwise, there have been no changes to her past medical history, surgical history, family history, or social history.    Review of systems otherwise negative other than that mentioned in the HPI.    Objective:   Vitals reviewed and all within normal limits.     Physical Exam Vitals reviewed.  Constitutional:      Appearance: She is well-developed.     Comments: Talkative. Very lovely.  HENT:     Head: Normocephalic and atraumatic.     Right Ear: Tympanic membrane, ear canal and external ear normal. No drainage, swelling or tenderness. Tympanic membrane is not injected, scarred, erythematous, retracted or bulging.     Left Ear: Tympanic membrane, ear canal and external ear normal. No drainage, swelling or tenderness. Tympanic membrane is not injected, scarred, erythematous, retracted or bulging.     Nose: Rhinorrhea present. No nasal deformity, septal deviation or  mucosal edema.     Right Turbinates: Enlarged, swollen and pale.     Left Turbinates: Enlarged, swollen and pale.     Right Sinus: No maxillary sinus tenderness or frontal sinus tenderness.     Left Sinus: No maxillary sinus tenderness or frontal sinus tenderness.     Mouth/Throat:     Mouth: Mucous membranes are not pale and not dry.     Pharynx: Uvula midline.  Eyes:     General:        Right eye: No discharge.        Left eye: No discharge.     Conjunctiva/sclera: Conjunctivae normal.     Right eye: Right conjunctiva is not injected. No chemosis.    Left eye: Left conjunctiva is not injected. No chemosis.    Pupils: Pupils are equal, round, and reactive to light.  Cardiovascular:     Rate and Rhythm: Normal rate and regular rhythm.     Heart sounds: Normal heart sounds.  Pulmonary:     Effort: Pulmonary effort is normal. No tachypnea, accessory muscle usage or respiratory distress.     Breath sounds: Normal breath sounds. No wheezing, rhonchi or rales.     Comments: Moving air well in all lung fields. No increased work of breathing noted.  Chest:  Chest wall: No tenderness.  Abdominal:     Tenderness: There is no abdominal tenderness. There is no guarding or rebound.  Lymphadenopathy:     Head:     Right side of head: No submandibular, tonsillar or occipital adenopathy.     Left side of head: No submandibular, tonsillar or occipital adenopathy.     Cervical: No cervical adenopathy.  Skin:    Coloration: Skin is not pale.     Findings: No abrasion, erythema, petechiae or rash. Rash is not papular, urticarial or vesicular.  Neurological:     Mental Status: She is alert.  Psychiatric:        Behavior: Behavior is cooperative.      Diagnostic studies:   Allergy  Studies:     Intradermal - 06/07/23 0850     Time Antigen Placed 9149    Allergen Manufacturer Jestine    Location Arm    Number of Test 14    Control Negative    Bahia Negative    Bermuda Negative     Johnson Negative    7 Grass Negative    Ragweed Mix 1+    Weed Mix Negative    Tree Mix Negative    Mold 1 Negative    Mold 2 Negative    Mold 3 Negative    Mold 4 Negative    Cat Negative    Dog Negative             Allergy  testing results were read and interpreted by myself, documented by clinical staff.      Marty Shaggy, MD  [facility] 

## 2023-06-27 NOTE — Addendum Note (Signed)
 Addended by: Glena Norfolk on: 06/27/2023 02:03 PM   Modules accepted: Orders

## 2023-07-12 ENCOUNTER — Ambulatory Visit (HOSPITAL_COMMUNITY)
Admission: RE | Admit: 2023-07-12 | Discharge: 2023-07-12 | Disposition: A | Discharge status: Home / Self Care | Payer: No Typology Code available for payment source | Source: Ambulatory Visit | Attending: "Endocrinology | Admitting: "Endocrinology

## 2023-07-12 DIAGNOSIS — E042 Nontoxic multinodular goiter: Secondary | ICD-10-CM | POA: Insufficient documentation

## 2023-08-11 LAB — T4, FREE: Free T4: 1.36 ng/dL (ref 0.82–1.77)

## 2023-08-11 LAB — TSH: TSH: 0.316 u[IU]/mL — ABNORMAL LOW (ref 0.450–4.500)

## 2023-08-15 ENCOUNTER — Ambulatory Visit: Payer: No Typology Code available for payment source | Admitting: Nutrition

## 2023-08-15 ENCOUNTER — Ambulatory Visit: Payer: No Typology Code available for payment source | Admitting: "Endocrinology

## 2023-08-30 ENCOUNTER — Encounter: Payer: Self-pay | Admitting: Allergy & Immunology

## 2023-08-30 ENCOUNTER — Other Ambulatory Visit: Payer: Self-pay

## 2023-08-30 ENCOUNTER — Ambulatory Visit (INDEPENDENT_AMBULATORY_CARE_PROVIDER_SITE_OTHER): Payer: No Typology Code available for payment source | Admitting: Allergy & Immunology

## 2023-08-30 VITALS — BP 132/70 | HR 96 | Temp 96.7°F | Resp 16 | Ht 63.0 in | Wt 189.2 lb

## 2023-08-30 DIAGNOSIS — D806 Antibody deficiency with near-normal immunoglobulins or with hyperimmunoglobulinemia: Secondary | ICD-10-CM | POA: Diagnosis not present

## 2023-08-30 DIAGNOSIS — J302 Other seasonal allergic rhinitis: Secondary | ICD-10-CM | POA: Diagnosis not present

## 2023-08-30 DIAGNOSIS — J4540 Moderate persistent asthma, uncomplicated: Secondary | ICD-10-CM | POA: Diagnosis not present

## 2023-08-30 DIAGNOSIS — J3089 Other allergic rhinitis: Secondary | ICD-10-CM

## 2023-08-30 MED ORDER — DOXYCYCLINE HYCLATE 100 MG PO TABS: 100.0000 mg | ORAL_TABLET | Freq: Two times a day (BID) | ORAL | 0 refills | Status: AC

## 2023-08-30 MED ORDER — SPIRIVA RESPIMAT 1.25 MCG/ACT IN AERS: 2.0000 | INHALATION_SPRAY | Freq: Every day | RESPIRATORY_TRACT | 1 refills | Status: DC

## 2023-08-30 NOTE — Patient Instructions (Addendum)
 She remains on 1. 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 - Since your last pneumonia shot was in 2008, we need you to get another one nose and then we can recheck your Streptococcal titers. - You can get the PNEUMOVAX or the PREVNAR-20.  - This will help determine whether or not we can start immunoglobulin replacement or prophylactic (preventative) antibiotics.  - Prophylactic antibiotics are taken daily to prevent infections. - Immunoglobulin replacement consists of infusions (weekly, biweekly, or monthly) to boost your immune system and prevent you from getting infections.  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 Acute sinusitis  - With your current symptoms and time course, antibiotics are needed: doxycycline  100mg  twice daily for 14 days - Continue with nasal saline spray (i.e., Simply Saline) or nasal saline lavage (i.e., NeilMed) as needed prior to medicated nasal sprays. - For thick post nasal drainage, add guaifenesin [phone] mg (Mucinex) twice daily as needed for mucous thinning with adequate hydration to help it work.   2. Moderate persistent asthma, uncomplicated - Lung testing looks great today.  - Since you are using albuterol so frequently, I think we need to be more aggressive with asthma control with the addition of Spiriva. - sample and demonstration provided.  - Daily controller medication(s): Symbicort  160/4.14mcg two puffs twice daily with spacer and Spiriva 1.25mcg two puffs once daily - Prior to physical activity: albuterol 2 puffs 10-15 minutes before physical activity. - Rescue medications: albuterol 4 puffs every 4-6 hours as needed - Asthma control goals:  * Full participation in all desired activities (may need albuterol before activity) * Albuterol use two  time or less a week on average (not counting use with activity) * Cough interfering with sleep two time or less a month * Oral steroids no more than once a year * No hospitalizations  3. Chronic rhinitis (dust mites, cockroach, mountain cedar, ragweed, weeds) - We will start allergy  shots for long term control, but we will ignore the cockroach since we can mix into one vial if we do not include the cockroach.  - Make an appointment to start in 3 weeks.  - Continue with Allegra  (fexofenadine ) 180 mg daily. - Continue with Nasacort one spray per nostril twice daily.  4. Return in about 2 months (around 10/30/2023). You can have the follow up appointment with Dr. Idolina Maker or a Nurse Practicioner (our Nurse Practitioners are excellent and always have Physician oversight!).    Please inform us  of any Emergency Department visits, hospitalizations, or changes in symptoms. Call us  before going to the ED for breathing or allergy  symptoms since we might be able to fit you in for a sick visit. Feel free to contact us  anytime with any questions, problems, or concerns.  It was a pleasure to see you again today!  Websites that have reliable patient information: 1. American Academy of Asthma, Allergy , and Immunology: www.aaaai.org 2. Food Allergy  Research and Education (FARE): foodallergy.org 3. Mothers of Asthmatics: http://www.asthmacommunitynetwork.org 4. American College of Allergy , Asthma, and Immunology: www.acaai.org      "Like" us  on Facebook and Instagram for our latest updates!      A healthy democracy works best when [employer] participate! Make sure you are registered to vote! If you have moved or changed any of your contact information, you will need to get this updated before voting! Scan the QR codes below to learn more!  Reducing Pollen Exposure  The American Academy of Allergy , Asthma and Immunology suggests the following steps to reduce your exposure to pollen  during allergy  seasons.    Do not hang sheets or clothing out to dry; pollen may collect on these items. Do not mow lawns or spend time around freshly cut grass; mowing stirs up pollen. Keep windows closed at night.  Keep car windows closed while driving. Minimize morning activities outdoors, a time when pollen counts are usually at their highest. Stay indoors as much as possible when pollen counts or humidity is high and on windy days when pollen tends to remain in the air longer. Use air conditioning when possible.  Many air conditioners have filters that trap the pollen spores. Use a HEPA room air filter to remove pollen form the indoor air you breathe.  Control of Dust Mite Allergen    Dust mites play a major role in allergic asthma and rhinitis.  They occur in environments with high humidity wherever human skin is found.  Dust mites absorb humidity from the atmosphere (ie, they do not drink) and feed on organic matter (including shed human and animal skin).  Dust mites are a microscopic type of insect that you cannot see with the naked eye.  High levels of dust mites have been detected from mattresses, pillows, carpets, upholstered furniture, bed covers, clothes, soft toys and any woven material.  The principal allergen of the dust mite is found in its feces.  A gram of dust may contain 1,000 mites and 250,000 fecal particles.  Mite antigen is easily measured in the air during house cleaning activities.  Dust mites do not bite and do not cause harm to humans, other than by triggering allergies/asthma.    Ways to decrease your exposure to dust mites in your home:  Encase mattresses, box springs and pillows with a mite-impermeable barrier or cover   Wash sheets, blankets and drapes weekly in hot water (130 F) with detergent and dry them in a dryer on the hot setting.  Have the room cleaned frequently with a vacuum cleaner and a damp dust-mop.  For carpeting or rugs, vacuuming with a vacuum  cleaner equipped with a high-efficiency particulate air (HEPA) filter.  The dust mite allergic individual should not be in a room which is being cleaned and should wait 1 hour after cleaning before going into the room. Do not sleep on upholstered furniture (eg, couches).   If possible removing carpeting, upholstered furniture and drapery from the home is ideal.  Horizontal blinds should be eliminated in the rooms where the person spends the most time (bedroom, study, television room).  Washable vinyl, roller-type shades are optimal. Remove all non-washable stuffed toys from the bedroom.  Wash stuffed toys weekly like sheets and blankets above.   Reduce indoor humidity to less than 50%.  Inexpensive humidity monitors can be purchased at most hardware stores.  Do not use a humidifier as can make the problem worse and are not recommended.  Control of Cockroach Allergen  Cockroach allergen has been identified as an important cause of acute attacks of asthma, especially in urban settings.  There are fifty-five species of cockroach that exist in the [place] , however only three, the [place], [place] and [place] species produce allergen that can affect patients with Asthma.  Allergens can be obtained from fecal particles, egg casings and secretions from cockroaches.    Remove food sources. Reduce access to water. Seal access and entry points. Spray runways with  0.5-1% Diazinon or Chlorpyrifos Blow boric acid power under stoves and refrigerator. Place bait stations (hydramethylnon) at feeding sites.  Allergy  Shots  Allergies are the result of a chain reaction that starts in the immune system. Your immune system controls how your body defends itself. For instance, if you have an allergy  to pollen, your immune system identifies pollen as an invader or allergen. Your immune system overreacts by producing antibodies called Immunoglobulin E (IgE). These antibodies travel to cells that release chemicals,  causing an allergic reaction.  The concept behind allergy  immunotherapy, whether it is received in the form of shots or tablets, is that the immune system can be desensitized to specific allergens that trigger allergy  symptoms. Although it requires time and patience, the payback can be long-term relief. Allergy  injections contain a dilute solution of those substances that you are allergic to based upon your skin testing and allergy  history.   How Do Allergy  Shots Work?  Allergy  shots work much like a vaccine. Your body responds to injected amounts of a particular allergen given in increasing doses, eventually developing a resistance and tolerance to it. Allergy  shots can lead to decreased, minimal or no allergy  symptoms.  There generally are two phases: build-up and maintenance. Build-up often ranges from three to six months and involves receiving injections with increasing amounts of the allergens. The shots are typically given once or twice a week, though more rapid build-up schedules are sometimes used.  The maintenance phase begins when the most effective dose is reached. This dose is different for each person, depending on how allergic you are and your response to the build-up injections. Once the maintenance dose is reached, there are longer periods between injections, typically two to four weeks.  Occasionally doctors give cortisone-type shots that can temporarily reduce allergy  symptoms. These types of shots are different and should not be confused with allergy  immunotherapy shots.  Who Can Be Treated with Allergy  Shots?  Allergy  shots may be a good treatment approach for people with allergic rhinitis (hay fever), allergic asthma, conjunctivitis (eye allergy ) or stinging insect allergy .   Before deciding to begin allergy  shots, you should consider:   The length of allergy  season and the severity of your symptoms  Whether medications and/or changes to your environment can control your  symptoms  Your desire to avoid long-term medication use  Time: allergy  immunotherapy requires a major time commitment  Cost: may vary depending on your insurance coverage  Allergy  shots for children age 45 and older are effective and often well tolerated. They might prevent the onset of new allergen sensitivities or the progression to asthma.  Allergy  shots are not started on patients who are pregnant but can be continued on patients who become pregnant while receiving them. In some patients with other medical conditions or who take certain common medications, allergy  shots may be of risk. It is important to mention other medications you talk to your allergist.   What are the two types of build-ups offered:   RUSH or Rapid Desensitization -- one day of injections lasting from 8:30-4:30pm, injections every 1 hour.  Approximately half of the build-up process is completed in that one day.  The following week, normal build-up is resumed, and this entails ~16 visits either weekly or twice weekly, until reaching your "maintenance dose" which is continued weekly until eventually getting spaced out to every month for a duration of 3 to 5 years. The regular build-up appointments are nurse visits where the injections are administered, followed  by required monitoring for 30 minutes.    Traditional build-up -- weekly visits for 6 -12 months until reaching "maintenance dose", then continue weekly until eventually spacing out to every 4 weeks as above. At these appointments, the injections are administered, followed by required monitoring for 30 minutes.     Either way is acceptable, and both are equally effective. With the rush protocol, the advantage is that less time is spent here for injections overall AND you would also reach maintenance dosing faster (which is when the clinical benefit starts to become more apparent). Not everyone is a candidate for rapid desensitization.   IF we proceed with the RUSH  protocol, there are premedications which must be taken the day before and the day after the rush only (this includes antihistamines, steroids, and Singulair).  After the rush day, no prednisone or Singulair is required, and we just recommend antihistamines taken on your injection day.  What Is An Estimate of the Costs?  If you are interested in starting allergy  injections, please check with your insurance company about your coverage for both allergy  vial sets and allergy  injections.  Please do so prior to making the appointment to start injections.  The following are CPT codes to give to your insurance company. These are the amounts we BILL to the insurance company, but the amount YOU WILL PAY and WE RECEIVE IS SUBSTANTIALLY LESS and depends on the contracts we have with different insurance companies.   Amount Billed to Insurance One allergy  vial set  CPT 95165   $ 1200     Two allergy  vial set  CPT 95165   $ 2400     Three allergy  vial set  CPT 95165   $ 3600     One injection   CPT 95115   $ 35  Two injections   CPT 95117   $ 40 RUSH (Rapid Desensitization) CPT 95180 x 8 hours $500/hour  Regarding the allergy  injections, your co-pay may or may not apply with each injection, so please confirm this with your insurance company. When you start allergy  injections, 1 or 2 sets of vials are made based on your allergies.  Not all patients can be on one set of vials. A set of vials lasts 6 months to a year depending on how quickly you can proceed with your build-up of your allergy  injections. Vials are personalized for each patient depending on their specific allergens.  How often are allergy  injection given during the build-up period?   Injections are given at least weekly during the build-up period until your maintenance dose is achieved. Per the doctor's discretion, you may have the option of getting allergy  injections two times per week during the build-up period. However, there must be at least 48  hours between injections. The build-up period is usually completed within 6-12 months depending on your ability to schedule injections and for adjustments for reactions. When maintenance dose is reached, your injection schedule is gradually changed to every two weeks and later to every three weeks. Injections will then continue every 4 weeks. Usually, injections are continued for a total of 3-5 years.   When Will I Feel Better?  Some may experience decreased allergy  symptoms during the build-up phase. For others, it may take as long as 12 months on the maintenance dose. If there is no improvement after a year of maintenance, your allergist will discuss other treatment options with you.  If you aren't responding to allergy  shots, it may be because  there is not enough dose of the allergen in your vaccine or there are missing allergens that were not identified during your allergy  testing. Other reasons could be that there are high levels of the allergen in your environment or major exposure to non-allergic triggers like tobacco smoke.  What Is the Length of Treatment?  Once the maintenance dose is reached, allergy  shots are generally continued for three to five years. The decision to stop should be discussed with your allergist at that time. Some people may experience a permanent reduction of allergy  symptoms. Others may relapse and a longer course of allergy  shots can be considered.  What Are the Possible Reactions?  The two types of adverse reactions that can occur with allergy  shots are local and systemic. Common local reactions include very mild redness and swelling at the injection site, which can happen immediately or several hours after. Report a delayed reaction from your last injection. These include arm swelling or runny nose, watery eyes or cough that occurs within 12-24 hours after injection. A systemic reaction, which is less common, affects the entire body or a particular body system. They  are usually mild and typically respond quickly to medications. Signs include increased allergy  symptoms such as sneezing, a stuffy nose or hives.   Rarely, a serious systemic reaction called anaphylaxis can develop. Symptoms include swelling in the throat, wheezing, a feeling of tightness in the chest, nausea or dizziness. Most serious systemic reactions develop within 30 minutes of allergy  shots. This is why it is strongly recommended you wait in your doctor's office for 30 minutes after your injections. Your allergist is trained to watch for reactions, and his or her staff is trained and equipped with the proper medications to identify and treat them.   Report to the nurse immediately if you experience any of the following symptoms: swelling, itching or redness of the skin, hives, watery eyes/nose, breathing difficulty, excessive sneezing, coughing, stomach pain, diarrhea, or light headedness. These symptoms may occur within 15-20 minutes after injection and may require medication.   Who Should Administer Allergy  Shots?  The preferred location for receiving shots is your prescribing allergist's office. Injections can sometimes be given at another facility where the physician and staff are trained to recognize and treat reactions, and have received instructions by your prescribing allergist.  What if I am late for an injection?   Injection dose will be adjusted depending upon how many days or weeks you are late for your injection.   What if I am sick?   Please report any illness to the nurse before receiving injections. She may adjust your dose or postpone injections depending on your symptoms. If you have fever, flu, sinus infection or chest congestion it is best to postpone allergy  injections until you are better. Never get an allergy  injection if your asthma is causing you problems. If your symptoms persist, seek out medical care to get your health problem under control.  What If I am or Become  Pregnant:  Women that become pregnant should schedule an appointment with [facility] before receiving any further allergy  injections.

## 2023-08-30 NOTE — Progress Notes (Signed)
 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 - Since your last pneumonia shot was in 2008, we need you to get another one nose and then we can recheck your Streptococcal titers. - You can get the PNEUMOVAX or the PREVNAR-20.  - This will help determine whether or not we can start immunoglobulin replacement or prophylactic (preventative) antibiotics.  - Prophylactic antibiotics are taken daily to prevent infections. - Immunoglobulin replacement consists of infusions (weekly, biweekly, or monthly) to boost your immune system and prevent you from getting infections.  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 Acute sinusitis  - With your current symptoms and time course, antibiotics are needed: doxycycline  100mg  twice daily for 14 days - Continue with nasal saline spray (i.e., Simply Saline) or nasal saline lavage (i.e., NeilMed) as needed prior to medicated nasal sprays. - For thick post nasal drainage, add guaifenesin [phone] mg (Mucinex) twice daily as needed for mucous thinning with adequate hydration to help it work.   2. Moderate persistent asthma, uncomplicated - Lung testing looks great today.  - Since you are using albuterol so frequently, I think we need  to be more aggressive with asthma control with the addition of Spiriva. - sample and demonstration provided.  - Daily controller medication(s): Symbicort  160/4.91mcg two puffs twice daily with spacer and Spiriva 1.25mcg two puffs once daily - Prior to physical activity: albuterol 2 puffs 10-15 minutes before physical activity. - Rescue medications: albuterol 4 puffs every 4-6 hours as needed - Asthma control goals:  * Full participation in all desired activities (may need albuterol before activity) * Albuterol use two time or less a week on average (not counting use with activity) * Cough interfering with sleep two time or less a month * Oral steroids no more than once a year * No hospitalizations  3. Chronic rhinitis (dust mites, cockroach, mountain cedar, ragweed, weeds) - We will start allergy  shots for long term control, but we will ignore the cockroach since we can mix into one vial if we do not include the cockroach.  - Make an appointment to start in 3 weeks.  - Continue with Allegra  (fexofenadine ) 180 mg daily. - Continue with Nasacort one spray per nostril twice daily.  4. Return in about 2 months (around 10/30/2023). You can have the follow up appointment with Dr. Idolina Maker or a Nurse Practicioner (our Nurse Practitioners are excellent and always have Physician oversight!).    Subjective:   Margaret Gay is a 58 y.o. female presenting today for follow up of  Chief Complaint  Patient presents with   Asthma   Sinus Problem    Has been using mucinex for her sinus issues -  white with constant drainage     Margaret Gay has a history of the following: Patient Active Problem List   Diagnosis Date Noted   Mixed hyperlipidemia 08/12/2021   Multinodular thyroid  12/17/2019   Hypothyroidism 12/17/2019  History obtained from: chart review and patient.  Discussed the use of AI scribe software for clinical note transcription with the patient and/or guardian, who gave verbal  consent to proceed.  Margaret Gay is a 58 y.o. female presenting for a follow up visit.  She was last seen in February 2025.  At that time, she was protective to only 7 out of 23 serotypes Streptococcus pneumonia.  We recommended that she get the last date of her pneumonia vaccine and give us  a call back with that information.  We discussed prophylactic antibiotics versus immunoglobulin replacement.  For her asthma, lung testing was not done.  We continue with Symbicort  160 mcg 2 puffs twice daily every day as well as albuterol as needed.  For her rhinitis, she had testing that was only positive to ragweed at the last visit.  She had previously had testing that was positive to dust mites, cockroach, cedar, and weeds.  Gave her information on allergen immunotherapy.  We also continue with Allegra  and Nasacort.  Since the last visit, she has not done well.  However, she has had 5 to 7 days of worsening congestion and sinus pain and pressure.  She has been using herand Mucinex without much improvement.  She has not had antibiotics in over 6 months.  Asthma/Respiratory Symptom History: She has a history of asthma since she was 58 years old. There is no mention of prior use of Spiriva, a medication that could potentially aid in her asthma management. She has been previously seen in February, and there is no documentation of any significant changes since then. She uses her emergency albuterol inhaler almost twice a day in addition to her daily controller medication, Symbicort , which she takes two puffs twice a day. Despite this regimen, she feels the need to use albuterol due to fear of needing it, indicating her asthma may not be well-controlled.  She is unable to tolerate her sleep apnea treatment, though specific symptoms or issues with the treatment were not detailed.  Allergic Rhinitis Symptom History: She remains on Allegra  and Nasacort for her environmental allergies.  She does remain interested in allergy   shots.  She was wondering why she cannot start them today.  She never made an appointment to start her injections at the last visit, so they were never ordered.  She does understand the time commitment.  [facility], so she is willing to go through with the process.  Infection Symptom History: She talk to her PCP.  Her last pneumonia shot was in 2008.  She is going to see her PCP this week and can request a Pneumovax this week.  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  Otherwise, there have been no changes to her past medical history, surgical history, family history, or social history.    Review of systems otherwise negative other than that mentioned in the HPI.    Objective:   Blood pressure 132/70, pulse 96, temperature (!) 96.7 F (35.9 C), temperature source Temporal, resp. rate 16, height 5\' 3"  (1.6 m), weight 189 lb 3.2 oz (85.8 kg), SpO2 97%. Body mass index is 33.52 kg/m.    Physical Exam Vitals reviewed.  Constitutional:      Appearance: She is well-developed.     Comments: Talkative. Very lovely.  HENT:     Head: Normocephalic and atraumatic.     Right Ear: Tympanic membrane,  ear canal and external ear normal. No drainage, swelling or tenderness. Tympanic membrane is not injected, scarred, erythematous, retracted or bulging.     Left Ear: Tympanic membrane, ear canal and external ear normal. No drainage, swelling or tenderness. Tympanic membrane is not injected, scarred, erythematous, retracted or bulging.     Nose: Mucosal edema and rhinorrhea present. No nasal deformity or septal deviation.     Right Turbinates: Enlarged, swollen and pale.     Left Turbinates: Enlarged, swollen and pale.     Right Sinus: Maxillary sinus tenderness present. No frontal sinus tenderness.     Left Sinus: Maxillary sinus tenderness present. No frontal sinus tenderness.      Mouth/Throat:     Lips: Pink.     Mouth: Mucous membranes are moist. Mucous membranes are not pale and not dry.     Pharynx: Uvula midline.  Eyes:     General: Lids are normal. Allergic shiner present.        Right eye: No discharge.        Left eye: No discharge.     Conjunctiva/sclera: Conjunctivae normal.     Right eye: Right conjunctiva is not injected. No chemosis.    Left eye: Left conjunctiva is not injected. No chemosis.    Pupils: Pupils are equal, round, and reactive to light.  Cardiovascular:     Rate and Rhythm: Normal rate and regular rhythm.     Heart sounds: Normal heart sounds.  Pulmonary:     Effort: Pulmonary effort is normal. No tachypnea, accessory muscle usage or respiratory distress.     Breath sounds: Normal breath sounds. No wheezing, rhonchi or rales.     Comments: Moving air well in all lung fields. No increased work of breathing noted.  Chest:     Chest wall: No tenderness.  Abdominal:     Tenderness: There is no abdominal tenderness. There is no guarding or rebound.  Lymphadenopathy:     Head:     Right side of head: No submandibular, tonsillar or occipital adenopathy.     Left side of head: No submandibular, tonsillar or occipital adenopathy.     Cervical: No cervical adenopathy.  Skin:    Coloration: Skin is not pale.     Findings: No abrasion, erythema, petechiae or rash. Rash is not papular, urticarial or vesicular.  Neurological:     Mental Status: She is alert.  Psychiatric:        Behavior: Behavior is cooperative.      Diagnostic studies:    Spirometry: results normal (FEV1: 1.77/85%, FVC: 2.77/106%, FEV1/FVC: 64%).    Spirometry consistent with normal pattern.    Allergy  Studies: none       Drexel Gentles, MD  [facility] 

## 2023-09-04 DIAGNOSIS — J3089 Other allergic rhinitis: Secondary | ICD-10-CM | POA: Diagnosis not present

## 2023-09-04 NOTE — Progress Notes (Signed)
 VIAL MADE ON 09/04/23 EXP 09/03/24

## 2023-09-04 NOTE — Progress Notes (Signed)
 Aeroallergen Immunotherapy  Ordering Provider: Dr. Drexel Gentles  Patient Details Name: Margaret Gay MRN: [id number] Date of Birth: [birth year]  Order 1 of 1  Vial Label: W/RW/T/DM  0.6 ml (Volume)  1:20 Concentration -- Ragweed Mix 0.2 ml (Volume)  1:20 Concentration -- Cocklebur 0.5 ml (Volume)  1:20 Concentration -- Weed Mix* 0.7 ml (Volume)  1:20 Concentration -- Eastern 10 Tree Mix (also Sweet Gum) 0.3 ml (Volume)  1:10 Concentration -- Cedar, red 0.8 ml (Volume)   AU Concentration -- Mite Mix (DF 5,000 & DP 5,000)   3.1  ml Extract Subtotal 1.9  ml Diluent  5.0  ml Maintenance Total  Schedule:  B  Blue Vial (1:100,000): Schedule B (6 doses) Yellow Vial (1:10,000): Schedule B (6 doses) Green Vial (1:1,000): Schedule B (6 doses) Red Vial (1:100): Schedule A (12 doses)  Special Instructions: After completion of the first Red Vial, please space to every two weeks. After completion of the second Red Vial, please space to every 4 weeks. Ok to up dose new vials at 0.36mL --> 0.3 mL --> 0.5 mL. Ok to come twice weekly, if desired, as long as there is 48 hours between injections.

## 2023-09-06 ENCOUNTER — Ambulatory Visit: Admitting: "Endocrinology

## 2023-09-06 ENCOUNTER — Ambulatory Visit: Admitting: Nutrition

## 2023-09-20 ENCOUNTER — Ambulatory Visit (INDEPENDENT_AMBULATORY_CARE_PROVIDER_SITE_OTHER)

## 2023-09-20 ENCOUNTER — Encounter: Payer: Self-pay | Admitting: Allergy & Immunology

## 2023-09-20 DIAGNOSIS — J309 Allergic rhinitis, unspecified: Secondary | ICD-10-CM | POA: Diagnosis not present

## 2023-09-20 MED ORDER — EPINEPHRINE 0.3 MG/0.3ML IJ SOAJ
0.3000 mg | INTRAMUSCULAR | 1 refills | Status: AC | PRN
Start: 2023-09-20 — End: ?

## 2023-09-20 NOTE — Progress Notes (Signed)
 Immunotherapy   Patient Details  Name: Margaret Gay MRN: [id number] Date of Birth: [birth year]  09/20/2023  [employer] started injections for  weeds, trees, and dust mites.  Following schedule: B  Frequency:2 times per week Epi-Pen:Prescription for Epi-Pen given Consent signed in office today and patient instructions given. Patient sat in the lobby for thirty minutes without an issue.    Brutus Caprice 09/20/2023, 10:16 AM

## 2023-09-22 ENCOUNTER — Ambulatory Visit (INDEPENDENT_AMBULATORY_CARE_PROVIDER_SITE_OTHER)

## 2023-09-22 ENCOUNTER — Encounter: Payer: Self-pay | Admitting: Allergy & Immunology

## 2023-09-22 DIAGNOSIS — J309 Allergic rhinitis, unspecified: Secondary | ICD-10-CM

## 2023-09-27 ENCOUNTER — Encounter: Payer: Self-pay | Admitting: Allergy & Immunology

## 2023-09-27 ENCOUNTER — Ambulatory Visit (INDEPENDENT_AMBULATORY_CARE_PROVIDER_SITE_OTHER)

## 2023-09-27 DIAGNOSIS — J309 Allergic rhinitis, unspecified: Secondary | ICD-10-CM

## 2023-09-29 ENCOUNTER — Ambulatory Visit (INDEPENDENT_AMBULATORY_CARE_PROVIDER_SITE_OTHER): Payer: Self-pay

## 2023-09-29 ENCOUNTER — Encounter: Payer: Self-pay | Admitting: Family Medicine

## 2023-09-29 DIAGNOSIS — J309 Allergic rhinitis, unspecified: Secondary | ICD-10-CM

## 2023-10-04 ENCOUNTER — Ambulatory Visit (INDEPENDENT_AMBULATORY_CARE_PROVIDER_SITE_OTHER)

## 2023-10-04 ENCOUNTER — Encounter: Payer: Self-pay | Admitting: Allergy & Immunology

## 2023-10-04 DIAGNOSIS — J309 Allergic rhinitis, unspecified: Secondary | ICD-10-CM

## 2023-10-06 ENCOUNTER — Ambulatory Visit (INDEPENDENT_AMBULATORY_CARE_PROVIDER_SITE_OTHER): Payer: Self-pay

## 2023-10-06 ENCOUNTER — Encounter: Payer: Self-pay | Admitting: Allergy & Immunology

## 2023-10-06 DIAGNOSIS — J309 Allergic rhinitis, unspecified: Secondary | ICD-10-CM | POA: Diagnosis not present

## 2023-10-10 ENCOUNTER — Ambulatory Visit: Admitting: Nutrition

## 2023-10-11 ENCOUNTER — Ambulatory Visit (INDEPENDENT_AMBULATORY_CARE_PROVIDER_SITE_OTHER): Payer: Self-pay

## 2023-10-11 ENCOUNTER — Encounter: Attending: Internal Medicine | Admitting: Nutrition

## 2023-10-11 ENCOUNTER — Encounter: Payer: Self-pay | Admitting: Allergy & Immunology

## 2023-10-11 ENCOUNTER — Encounter: Payer: Self-pay | Admitting: Nutrition

## 2023-10-11 VITALS — Ht 63.5 in | Wt 185.0 lb

## 2023-10-11 DIAGNOSIS — R7303 Prediabetes: Secondary | ICD-10-CM | POA: Diagnosis present

## 2023-10-11 DIAGNOSIS — E669 Obesity, unspecified: Secondary | ICD-10-CM | POA: Insufficient documentation

## 2023-10-11 DIAGNOSIS — J309 Allergic rhinitis, unspecified: Secondary | ICD-10-CM

## 2023-10-11 NOTE — Progress Notes (Signed)
 Medical Nutrition Therapy  Appointment Start time:  1030  Appointment End time: 1100  Primary concerns today: Pre Diabetes  Referral diagnosis: R73.03 Preferred learning style: No Preference  Learning readiness: Ready    NUTRITION ASSESSMENT Follow up Lost 3 lbs but lost more on her own home scales. Eating more vegetables and fruits and salads. I want to get down to 170 lbs. I have a lot of appointments to go to and don't always have the best choices for meals.  Goals set previously Keep working on eating balanced meals-Still work on progress. Improved. Keep drinking water-done Get A1C to below 5.7%- A1C 6.1%.  Reduce eating out to 1 a week.- work in progress. Try to get to  [facility] for exercise weekly.-still has to do that. Anthropometrics  Wt Readings from Last 3 Encounters:  10/11/23 185 lb (83.9 kg)  08/30/23 189 lb 3.2 oz (85.8 kg)  05/10/23 208 lb (94.3 kg)   Ht Readings from Last 3 Encounters:  10/11/23 5' 3.5 (1.613 m)  08/30/23 5' 3 (1.6 m)  04/07/23 5' 3.5 (1.613 m)   Body mass index is 32.26 kg/m. @BMIFA @ Facility age limit for growth %iles is 20 years. Facility age limit for growth %iles is 20 years.   Clinical Medical Hx: Multinodular thyroid , Hyperlipidemia, elevated BS. Medications: See chart Labs: Renal: Cret 1.32 mg/dl, eGFR 47 mg/dl. A1C 6.1% 12/22.  Lipid Panel     Component Value Date/Time   CHOL 215 (A) 07/05/2019 0000   TRIG 165 (A) 07/05/2019 0000   HDL 63 07/05/2019 0000   . Notable Signs/Symptoms: Weight gain,   Lifestyle & Dietary Hx LIves in a house with a friend.  Estimated daily fluid intake: 40 oz Supplements:  Sleep: 4-5 hrs Stress / self-care: None issues. Current average weekly physical activity: walk a little   24-Hr Dietary Recall First Meal:Oatmeal and [place] sausage, tea Snack: banana Second Meal: fish-fried, grapes, water Snack: grapes Third Meal:Chicken pot pie, water  Snack: Beverages: water  Estimated  Energy Needs Calories: 1200 Carbohydrate: 135g Protein: 90g Fat: 33g   NUTRITION DIAGNOSIS  NI-1.7 Predicted excessive energy intake As related to excess calorie and CHO intake.  As evidenced by A1C 6.1% and BMI 35 and only eating 2 meals per day with less activity.Margaret Gay   NUTRITION INTERVENTION  Nutrition education (E-1) on the following topics:  Meal planning and meal prepping.   Lifestyle Medicine  - Whole Food, Plant Predominant Nutrition is highly recommended: Eat Plenty of vegetables, Mushrooms, fruits, Legumes, Whole Grains, Nuts, seeds in lieu of processed meats, processed snacks/pastries red meat, poultry, eggs.    -It is better to avoid simple carbohydrates including: Cakes, Sweet Desserts, Ice Cream, Soda (diet and regular), Sweet Tea, Candies, Chips, Cookies, Store Bought Juices, Alcohol in Excess of  1-2 drinks a day, Lemonade,  Artificial Sweeteners, Doughnuts, Coffee Creamers, Sugar-free Products, etc, etc.  This is not a complete list.....  Exercise: If you are able: 30 -60 minutes a day ,4 days a week, or 150 minutes a week.  The longer the better.  Combine stretch, strength, and aerobic activities.  If you were told in the past that you have high risk for cardiovascular diseases, you may seek evaluation by your heart doctor prior to initiating moderate to intense exercise programs.   Handouts Provided Include  Lifestyle Medicine Handouts  Learning Style & Readiness for Change Teaching method utilized: Visual & Auditory  Demonstrated degree of understanding via: Teach Back  Barriers to learning/adherence to lifestyle  change: Physical limitations with her back brace, knee brace and other medical conditions.  Goals Established by Pt   Pack lunches and snacks when going out for appointments. Work on meal planning and meal prepping. Continue to increase activity for desired weight loss. Lose 2 lbs per month  MONITORING & EVALUATION Dietary intake, weekly physical  activity, and weight in 3 months.  Next Steps  Patient is to work on meal planning and eating more whole plant based foods. Margaret Gay

## 2023-10-11 NOTE — Patient Instructions (Addendum)
 Goals  Pack lunches and snacks when going out for appointments. Work on meal planning and meal prepping. Continue to increase activity for desired weight loss. Lose 2 lbs per month

## 2023-10-13 ENCOUNTER — Ambulatory Visit (INDEPENDENT_AMBULATORY_CARE_PROVIDER_SITE_OTHER)

## 2023-10-13 ENCOUNTER — Encounter: Payer: Self-pay | Admitting: Allergy & Immunology

## 2023-10-13 DIAGNOSIS — J309 Allergic rhinitis, unspecified: Secondary | ICD-10-CM | POA: Diagnosis not present

## 2023-10-18 ENCOUNTER — Ambulatory Visit (INDEPENDENT_AMBULATORY_CARE_PROVIDER_SITE_OTHER)

## 2023-10-18 ENCOUNTER — Encounter: Payer: Self-pay | Admitting: Allergy & Immunology

## 2023-10-18 DIAGNOSIS — J309 Allergic rhinitis, unspecified: Secondary | ICD-10-CM

## 2023-10-19 ENCOUNTER — Telehealth: Payer: Self-pay | Admitting: "Endocrinology

## 2023-10-19 NOTE — Telephone Encounter (Signed)
 [facility] to see [facility]- Appt is 7/23. Do you want labs? If so, please order

## 2023-10-20 ENCOUNTER — Ambulatory Visit (INDEPENDENT_AMBULATORY_CARE_PROVIDER_SITE_OTHER): Payer: Self-pay

## 2023-10-20 ENCOUNTER — Encounter: Payer: Self-pay | Admitting: Allergy & Immunology

## 2023-10-20 DIAGNOSIS — J309 Allergic rhinitis, unspecified: Secondary | ICD-10-CM | POA: Diagnosis not present

## 2023-10-25 ENCOUNTER — Encounter: Payer: Self-pay | Admitting: Allergy & Immunology

## 2023-10-25 ENCOUNTER — Ambulatory Visit (INDEPENDENT_AMBULATORY_CARE_PROVIDER_SITE_OTHER)

## 2023-10-25 DIAGNOSIS — J309 Allergic rhinitis, unspecified: Secondary | ICD-10-CM

## 2023-10-30 ENCOUNTER — Encounter: Payer: Self-pay | Admitting: Allergy & Immunology

## 2023-10-30 ENCOUNTER — Ambulatory Visit (INDEPENDENT_AMBULATORY_CARE_PROVIDER_SITE_OTHER): Admitting: Allergy & Immunology

## 2023-10-30 VITALS — BP 112/78 | HR 63 | Temp 97.7°F | Resp 18 | Ht 63.5 in | Wt 186.6 lb

## 2023-10-30 DIAGNOSIS — J3089 Other allergic rhinitis: Secondary | ICD-10-CM

## 2023-10-30 DIAGNOSIS — D806 Antibody deficiency with near-normal immunoglobulins or with hyperimmunoglobulinemia: Secondary | ICD-10-CM | POA: Diagnosis not present

## 2023-10-30 DIAGNOSIS — J0101 Acute recurrent maxillary sinusitis: Secondary | ICD-10-CM | POA: Diagnosis not present

## 2023-10-30 DIAGNOSIS — J4540 Moderate persistent asthma, uncomplicated: Secondary | ICD-10-CM | POA: Diagnosis not present

## 2023-10-30 DIAGNOSIS — J302 Other seasonal allergic rhinitis: Secondary | ICD-10-CM

## 2023-10-30 MED ORDER — CLARITHROMYCIN 500 MG PO TABS
500.0000 mg | ORAL_TABLET | Freq: Two times a day (BID) | ORAL | 0 refills | Status: AC
Start: 2023-10-30 — End: 2023-11-09

## 2023-10-30 NOTE — Progress Notes (Signed)
 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 Recurrent infections - multiple episodes of pneumonia - You were only protective to 7/23 serotypes of Streptococcus pneumoniae. - Since your last pneumonia shot was in 2008, we need you to get another one nose and then we can recheck your Streptococcal titers. - You can get the PNEUMOVAX or the PREVNAR-20.  - You can get this as long as you wait 48 hours before an allergy  shot!  - This will help determine whether or not we can start immunoglobulin replacement or prophylactic (preventative) antibiotics.  - Prophylactic antibiotics are taken daily to prevent infections. - Immunoglobulin replacement consists of infusions (weekly, biweekly, or monthly) to boost your immune system and prevent you from getting infections.  - Learning more about specific antibody deficiency here: https://primaryimmune.org/understanding-primary-immunodeficiency/types-of-pi/specific-antibody-deficiency  2. Acute sinusitis  - With your current symptoms and time course, antibiotics are needed: clarithromycin 500mg  twice daily for 10 days - Continue with nasal saline spray (i.e., Simply Saline) or nasal saline lavage (i.e., NeilMed) as needed prior to medicated nasal sprays. - For thick post nasal drainage, add guaifenesin [phone] mg (Mucinex) twice daily as needed for mucous thinning with adequate hydration to help it work.   2. Moderate persistent asthma, uncomplicated - We did not do  any lung testing today. - Agree that your CPAP is a good idea, so try to get back on that.  - This combination seems to be working really well.  - Daily controller medication(s): Symbicort  160/4.60mcg two puffs twice daily with spacer and Spiriva  1.25mcg two puffs once daily - Prior to physical activity: albuterol 2 puffs 10-15 minutes before physical activity. - Rescue medications: albuterol 4 puffs every 4-6 hours as needed - Asthma control goals:  * Full participation in all desired activities (may need albuterol before activity) * Albuterol use two time or less a week on average (not counting use with activity) * Cough interfering with sleep two time or less a month * Oral steroids no more than once a year * No hospitalizations  3. Chronic rhinitis (dust mites, cockroach, mountain cedar, ragweed, weeds) - Continue with the allergy  shots at the same schedule.  - Continue with Allegra  (fexofenadine ) 180 mg daily. - Continue with Nasacort one spray per nostril twice daily.  4. Keep your appointment with Arlean in mid July!    Subjective:   Margaret Gay is a 58 y.o. female presenting today for follow up of  Chief Complaint  Patient presents with   Asthma   Allergic Rhinitis     Pt reports that she was doing well with the injections and then a week ago started getting sick with running nose, sweats, sneezing, sinus pressure and congestion, coughing up yellow mucus, headaches.    Margaret Gay has a history of the following: Patient Active Problem List   Diagnosis Date Noted   Mixed hyperlipidemia 08/12/2021   Multinodular thyroid  12/17/2019   Hypothyroidism 12/17/2019    History obtained from: chart review and patient.  Discussed the use of AI scribe software for clinical note  transcription with the patient and/or guardian, who gave verbal consent to proceed.  Margaret Gay is a 58 y.o. female presenting for a follow up visit.  She was last seen in April 2025.  At that time, she was  only protective to 7 out of 23 serotypes of Streptococcus pneumonia.  We recommended that she get a Pneumovax or Prevnar 20.  This would help us  to determine whether she has specific antibody  deficiency.  We did treat her with doxycycline  for 2 weeks for sinusitis.  Her lung testing looked good.  We did recommend that she add on Spiriva  2 puffs once daily in addition to her Symbicort .  For her rhinitis, she decided to start allergy  shots.  We continued with Allegra  and Nasacort  Since the last visit, she has largely done well.   She has been experiencing sinus congestion and cough for the past week. Despite using her usual medications, including nasal sprays, she has not tried nasal rinses due to poor tolerance. Symptoms include coughing and sinus pressure. She was previously prescribed doxycycline  in April and has not taken any other antibiotics since then. She cannot take penicillins and is uncertain about her use of Biaxin, but azithromycin is tolerable.   Asthma/Respiratory Symptom History: She has asthma and her breathing was improving with the use of inhalers. She is currently using Spiriva  and Symbicort , which are working well for her, and Ventolin as needed. Heat exacerbates her asthma symptoms. She has been doing well with her medications. She has not been on prednisone and has not been to the ED for her symptoms at all.   Allergic Rhinitis Symptom History: She remains on her Allegra  and her Nasacort. This seems to be working well. She also thinks that her shots were helping with her symptoms overall and she was feeling much better with them.  Margaret Gay is on allergen immunotherapy. She receives one injection. Immunotherapy script #1 contains trees, weeds, and dust mites. She currently receives 0.56mL of the GOLD vial (1/10,000). She started shots May of 2025 and not yet reached maintenance.   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 She  has a history of knee surgery and is scheduled to see her surgeon this week. Heat helps alleviate her joint pain.  She has been losing weight, currently weighing 175 pounds, down from 204 pounds. She attributes her weight loss to dieting, as her chronic pain limits her ability to exercise.   Otherwise, there have been no changes to her past medical history, surgical history, family history, or social history.    Review of systems otherwise negative other than that mentioned in the HPI.    Objective:   Blood pressure 112/78, pulse 63, temperature 97.7 F (36.5 C), temperature source Temporal, resp. rate 18, height 5' 3.5 (1.613 m), weight 186 lb 9.6 oz (84.6 kg), SpO2 94%. Body mass index is 32.54 kg/m.    Physical Exam Vitals reviewed.  Constitutional:      Appearance: She is well-developed.     Comments: Talkative. Very lovely.  HENT:     Head: Normocephalic and atraumatic.     Right Ear: Tympanic membrane, ear canal and external ear normal. No drainage, swelling or tenderness. Tympanic membrane is not injected, scarred, erythematous, retracted or bulging.     Left Ear: Tympanic membrane, ear canal and external ear normal. No drainage, swelling or tenderness. Tympanic membrane is not injected, scarred, erythematous, retracted or bulging.     Nose:  No nasal deformity, septal deviation or mucosal edema.     Right Turbinates: Enlarged, swollen and pale.     Left Turbinates: Enlarged, swollen and pale.     Right Sinus: Maxillary sinus tenderness present. No frontal sinus tenderness.     Left Sinus: Maxillary sinus tenderness present. No frontal sinus tenderness.     Mouth/Throat:     Lips: Pink.     Mouth: Mucous membranes are moist. Mucous membranes are not pale and not dry.     Pharynx: Uvula midline.   Eyes:     General: Lids are normal. Allergic shiner present.        Right eye: No discharge.        Left eye: No discharge.     Conjunctiva/sclera: Conjunctivae normal.      Right eye: Right conjunctiva is not injected. No chemosis.    Left eye: Left conjunctiva is not injected. No chemosis.    Pupils: Pupils are equal, round, and reactive to light.    Cardiovascular:     Rate and Rhythm: Normal rate and regular rhythm.     Heart sounds: Normal heart sounds.  Pulmonary:     Effort: Pulmonary effort is normal. No tachypnea, accessory muscle usage or respiratory distress.     Breath sounds: Normal breath sounds. No wheezing, rhonchi or rales.     Comments: Moving air well in all lung fields. No increased work of breathing noted.  Chest:     Chest wall: No tenderness.  Lymphadenopathy:     Head:     Right side of head: No submandibular, tonsillar or occipital adenopathy.     Left side of head: No submandibular, tonsillar or occipital adenopathy.     Cervical: No cervical adenopathy.   Skin:    Coloration: Skin is not pale.     Findings: No abrasion, erythema, petechiae or rash. Rash is not papular, urticarial or vesicular.   Neurological:     Mental Status: She is alert.   Psychiatric:        Behavior: Behavior is cooperative.      Diagnostic studies: none      Marty Shaggy, MD  [facility] 

## 2023-10-30 NOTE — Patient Instructions (Addendum)
 1. Recurrent infections - multiple episodes of pneumonia - You were only protective to 7/23 serotypes of Streptococcus pneumoniae. - Since your last pneumonia shot was in 2008, we need you to get another one nose and then we can recheck your Streptococcal titers. - You can get the PNEUMOVAX or the PREVNAR-20.  - You can get this as long as you wait 48 hours before an allergy  shot!  - This will help determine whether or not we can start immunoglobulin replacement or prophylactic (preventative) antibiotics.  - Prophylactic antibiotics are taken daily to prevent infections. - Immunoglobulin replacement consists of infusions (weekly, biweekly, or monthly) to boost your immune system and prevent you from getting infections.  - Learning more about specific antibody deficiency here: https://primaryimmune.org/understanding-primary-immunodeficiency/types-of-pi/specific-antibody-deficiency  2. Acute sinusitis  - With your current symptoms and time course, antibiotics are needed: clarithromycin 500mg  twice daily for 10 days - Continue with nasal saline spray (i.e., Simply Saline) or nasal saline lavage (i.e., NeilMed) as needed prior to medicated nasal sprays. - For thick post nasal drainage, add guaifenesin [phone] mg (Mucinex) twice daily as needed for mucous thinning with adequate hydration to help it work.   2. Moderate persistent asthma, uncomplicated - We did not do any lung testing today. - Agree that your CPAP is a good idea, so try to get back on that.  - This combination seems to be working really well.  - Daily controller medication(s): Symbicort  160/4.32mcg two puffs twice daily with spacer and Spiriva  1.25mcg two puffs once daily - Prior to physical activity: albuterol 2 puffs 10-15 minutes before physical activity. - Rescue medications: albuterol 4 puffs every 4-6 hours as needed - Asthma control goals:  * Full participation in all desired activities (may need albuterol before activity) *  Albuterol use two time or less a week on average (not counting use with activity) * Cough interfering with sleep two time or less a month * Oral steroids no more than once a year * No hospitalizations  3. Chronic rhinitis (dust mites, cockroach, mountain cedar, ragweed, weeds) - Continue with the allergy  shots at the same schedule.  - Continue with Allegra  (fexofenadine ) 180 mg daily. - Continue with Nasacort one spray per nostril twice daily.  4. Keep your appointment with Arlean in mid July!    Please inform us  of any Emergency Department visits, hospitalizations, or changes in symptoms. Call us  before going to the ED for breathing or allergy  symptoms since we might be able to fit you in for a sick visit. Feel free to contact us  anytime with any questions, problems, or concerns.  It was a pleasure to see you again today!  Websites that have reliable patient information: 1. American Academy of Asthma, Allergy , and Immunology: www.aaaai.org 2. Food Allergy  Research and Education (FARE): foodallergy.org 3. Mothers of Asthmatics: http://www.asthmacommunitynetwork.org 4. [employer] of Allergy , Asthma, and Immunology: www.acaai.org      "Like" us  on Facebook and Instagram for our latest updates!      A healthy democracy works best when [employer] participate! Make sure you are registered to vote! If you have moved or changed any of your contact information, you will need to get this updated before voting! Scan the QR codes below to learn more!

## 2023-11-01 ENCOUNTER — Ambulatory Visit: Admitting: Family Medicine

## 2023-11-15 ENCOUNTER — Encounter: Payer: Self-pay | Admitting: Family Medicine

## 2023-11-15 ENCOUNTER — Ambulatory Visit (INDEPENDENT_AMBULATORY_CARE_PROVIDER_SITE_OTHER): Admitting: Family Medicine

## 2023-11-15 ENCOUNTER — Ambulatory Visit: Admitting: Family Medicine

## 2023-11-15 ENCOUNTER — Other Ambulatory Visit: Payer: Self-pay

## 2023-11-15 VITALS — BP 128/70 | HR 84 | Temp 97.3°F | Resp 18 | Ht 63.78 in | Wt 184.2 lb

## 2023-11-15 DIAGNOSIS — J4540 Moderate persistent asthma, uncomplicated: Secondary | ICD-10-CM | POA: Insufficient documentation

## 2023-11-15 DIAGNOSIS — J0101 Acute recurrent maxillary sinusitis: Secondary | ICD-10-CM | POA: Diagnosis not present

## 2023-11-15 DIAGNOSIS — J3089 Other allergic rhinitis: Secondary | ICD-10-CM | POA: Diagnosis not present

## 2023-11-15 DIAGNOSIS — B999 Unspecified infectious disease: Secondary | ICD-10-CM | POA: Diagnosis not present

## 2023-11-15 DIAGNOSIS — J302 Other seasonal allergic rhinitis: Secondary | ICD-10-CM | POA: Insufficient documentation

## 2023-11-15 MED ORDER — DOXYCYCLINE HYCLATE 100 MG PO TABS: 100.0000 mg | ORAL_TABLET | Freq: Two times a day (BID) | ORAL | 0 refills | Status: DC

## 2023-11-15 NOTE — Patient Instructions (Addendum)
 Asthma Continue Symbicort  160-2 puffs twice a day with a spacer to prevent cough or wheeze Continue Spiriva  2 puffs once a day to prevent cough or wheeze Continue albuterol 2 puffs once every 4 hours if needed for cough or wheeze You may use albuterol 2 puffs 5 to 15 minutes before activity to decrease cough or wheeze  Acute sinusitis Begin doxycycline  100 mg twice a day for 10 days Begin nasal saline rinses Begin Nasacort 2 sprays in each nostril daily Begin Mucinex 1200 mg twice a day and increase fluid intake in order to thin mucus  Allergic rhinitis Continue Allegra  as needed, continue Nasacort daily Continue allergen immunotherapy and have access to an epinephrine  autoinjector sent  Recurrent infections Get a PPSV 23 vaccine.  Let us  know when you get this vaccine and we will check your lab work in 4 to 6 weeks  Call the clinic if this treatment plan is not working well for you.  Follow up in 2 months or sooner if needed.

## 2023-11-15 NOTE — Progress Notes (Signed)
 [address] [place] [place] [address] Dept: [phone]  FOLLOW UP NOTE  Patient ID: [name], female    DOB: 03/25/1966  Age: 58 y.o. MRN: [id number] Date of Office Visit: 11/15/2023  Assessment  Chief Complaint: Sinus Problem (Had to stop the antibiotic due to having diarrhea, still chronic infection )  HPI Margaret Gay is a 58 year old female who presents to the clinic for an acute visit for evaluation of sinus issues. She was last seen in this clinic on 10/30/2023 by Dr. Iva for evaluation of acute bacterial sinusitis, asthma, chronic rhinitis, and recurrent infection.  At that time, she was prescribed clarithromycin  which she took for 2-3 days before developing diarrhea and stopping this medication.   At today's visit, she reports that she continues to experience sinus headache, sinus pressure, nasal congestion, and thick postnasal drainage. She denies fever, sweats, chills or sick contacts. She reports that her sinus issues have not resolved since her last visit to this clinic. Otherwise, chronic rhinitis is reported as moderately well-controlled with occasional nasal symptoms.  She continues Allegra  daily and Nasacort daily.  She is not currently using nasal saline rinses.  She continues allergen immunotherapy directed toward weed pollen, tree pollen and dust mites with no large or local reactions.  She reports a significant decrease in her symptoms of allergic rhinitis while continuing on allergen immunotherapy.  Asthma is reported as well controlled with no symptoms including shortness of breath, cough or wheeze with activity or rest. She continues Symbicort  160-2 puffs twice a day with a spacer and rarely needs to use her albuterol for relief of asthma symptoms.   She reports frequent infections requiring antibiotics for resolution. Her last immune screening on 04/07/2023 indicated normal immunoglobulin levels and protective titers to tetanus and diptheria,  however, were only protective to 7/23 pneumococcal serotypes. She presents paperwork from [facility] indicating pneumococcal unspecified formul* on 02/28/2007 and pneumococcal conjugate PVC 20 on 11/12/2020. She has not yet received the PPSV23 injection. She reports having between 6-9 infections requiring antibiotics each year. She has received doxycycline  three times over the last several months and clarithromycin  once. We had a detailed discussion regarding the need for PPSV 23 vaccine and repeat titers as well as some treatment options if the titers do not improve.   Her current medications are listed in the chart  Drug Allergies:  Allergies  Allergen Reactions   Amlodipine Swelling   Azelastine     Other Reaction(s): Bleeding from nose   Compazine [Prochlorperazine Edisylate]     Increase bp   Erythromycin Itching   Fluticasone      Other Reaction(s): Bleeding from nose   Penicillins Swelling   Voltaren [Diclofenac Sodium]     Gi upset    Physical Exam: BP 128/70 (BP Location: Right Arm, Patient Position: Sitting, Cuff Size: Normal)   Pulse 84   Temp (!) 97.3 F (36.3 C) (Temporal)   Resp 18   Ht 5' 3.78 (1.62 m)   Wt 184 lb 3.2 oz (83.6 kg)   SpO2 97%   BMI 31.84 kg/m    Physical Exam Vitals reviewed.  Constitutional:      Appearance: Normal appearance.  HENT:     Head: Normocephalic and atraumatic.     Right Ear: Tympanic membrane normal.     Left Ear: Tympanic membrane normal.     Nose:     Comments: Bilateral nares edematous and pale with thick nasal drainage noted. Pharynx  normal. Ears normal. Eyes normal.    Mouth/Throat:     Pharynx: Oropharynx is clear.  Eyes:     Conjunctiva/sclera: Conjunctivae normal.  Cardiovascular:     Rate and Rhythm: Normal rate and regular rhythm.     Heart sounds: Normal heart sounds. No murmur heard. Pulmonary:     Effort: Pulmonary effort is normal.     Breath sounds: Normal breath sounds.     Comments: Lungs  clear to auscultation Musculoskeletal:        General: Normal range of motion.     Cervical back: Normal range of motion and neck supple.  Skin:    General: Skin is warm and dry.  Neurological:     Mental Status: She is alert and oriented to person, place, and time.  Psychiatric:        Mood and Affect: Mood normal.        Behavior: Behavior normal.        Thought Content: Thought content normal.        Judgment: Judgment normal.    Assessment and Plan: 1. Moderate persistent asthma, uncomplicated   2. Recurrent infections   3. Seasonal and perennial allergic rhinitis   4. Acute recurrent maxillary sinusitis     Meds ordered this encounter  Medications   doxycycline  (VIBRA -TABS) 100 MG tablet    Sig: Take 1 tablet (100 mg total) by mouth 2 (two) times daily.    Dispense:  20 tablet    Refill:  0    Patient Instructions  Asthma Continue Symbicort  160-2 puffs twice a day with a spacer to prevent cough or wheeze Continue Spiriva  2 puffs once a day to prevent cough or wheeze Continue albuterol 2 puffs once every 4 hours if needed for cough or wheeze You may use albuterol 2 puffs 5 to 15 minutes before activity to decrease cough or wheeze  Acute sinusitis Begin doxycycline  100 mg twice a day for 10 days Begin nasal saline rinses Begin Nasacort 2 sprays in each nostril daily Begin Mucinex 1200 mg twice a day and increase fluid intake in order to thin mucus  Allergic rhinitis Continue Allegra  as needed, continue Nasacort daily Continue allergen immunotherapy and have access to an epinephrine  autoinjector sent  Recurrent infections Get a PPSV 23 vaccine.  Let us  know when you get this vaccine and we will check your lab work in 4 to 6 weeks  Call the clinic if this treatment plan is not working well for you.  Follow up in 2 months or sooner if needed.  Return in about 2 months (around 01/16/2024), or if symptoms worsen or fail to improve.    Thank you for the  opportunity to care for this patient.  Please do not hesitate to contact me with questions.  Arlean Mutter, FNP [facility]  

## 2023-11-16 ENCOUNTER — Other Ambulatory Visit: Payer: Self-pay | Admitting: "Endocrinology

## 2023-11-16 DIAGNOSIS — E039 Hypothyroidism, unspecified: Secondary | ICD-10-CM

## 2023-11-16 NOTE — Telephone Encounter (Signed)
**Note De-identified  [place] Obfuscation** Please advise 

## 2023-11-17 ENCOUNTER — Ambulatory Visit: Admitting: Family Medicine

## 2023-11-21 LAB — LIPID PANEL
Chol/HDL Ratio: 2.4 ratio (ref 0.0–4.4)
Cholesterol, Total: 195 mg/dL (ref 100–199)
HDL: 80 mg/dL (ref >39)
LDL Chol Calc (NIH): 95 mg/dL (ref 0–99)
Triglycerides: 116 mg/dL (ref 0–149)
VLDL Cholesterol Cal: 20 mg/dL (ref 5–40)

## 2023-11-21 LAB — TSH: TSH: 0.374 u[IU]/mL — ABNORMAL LOW (ref 0.450–4.500)

## 2023-11-21 LAB — T3: T3, Total: 93 ng/dL (ref 71–180)

## 2023-11-21 LAB — T4, FREE: Free T4: 1.17 ng/dL (ref 0.82–1.77)

## 2023-11-22 ENCOUNTER — Ambulatory Visit (INDEPENDENT_AMBULATORY_CARE_PROVIDER_SITE_OTHER): Admitting: "Endocrinology

## 2023-11-22 ENCOUNTER — Encounter: Payer: Self-pay | Admitting: Family Medicine

## 2023-11-22 ENCOUNTER — Other Ambulatory Visit: Payer: Self-pay

## 2023-11-22 ENCOUNTER — Encounter: Payer: Self-pay | Admitting: "Endocrinology

## 2023-11-22 VITALS — BP 110/64 | HR 84 | Ht 63.5 in | Wt 182.2 lb

## 2023-11-22 DIAGNOSIS — E039 Hypothyroidism, unspecified: Secondary | ICD-10-CM | POA: Diagnosis not present

## 2023-11-22 DIAGNOSIS — E782 Mixed hyperlipidemia: Secondary | ICD-10-CM | POA: Diagnosis not present

## 2023-11-22 DIAGNOSIS — E042 Nontoxic multinodular goiter: Secondary | ICD-10-CM

## 2023-11-22 MED ORDER — LEVOTHYROXINE SODIUM 50 MCG PO TABS: 50.0000 ug | ORAL_TABLET | Freq: Every day | ORAL | 1 refills | Status: DC

## 2023-11-22 NOTE — Progress Notes (Signed)
 11/22/2023, 10:58 AM  Endocrinology follow-up note  Subjective:    Patient ID: Margaret Gay, female    DOB: 07/19/1965, PCP Vinie Allean BIRCH, MD   Past Medical History:  Diagnosis Date   Angio-edema    Asthma    Fibromyalgia    GERD (gastroesophageal reflux disease)    Headache(784.0)    Recurrent upper respiratory infection (URI)    Seasonal allergies    Seizures (HCC)    Sleep apnea    uses cpap   TIA (transient ischemic attack)    Past Surgical History:  Procedure Laterality Date   ABDOMINAL HYSTERECTOMY     ANKLE ARTHROSCOPY     rt x2   ANKLE ARTHROSCOPY  09/07/2011   Procedure: ANKLE ARTHROSCOPY;  Surgeon: Deward DELENA Schwartz, MD;  Location: [facility];  Service: Orthopedics;  Laterality: Right;  subtalar arthrotomy with tenosynovectomy with extensive debridement   APPENDECTOMY     OOPHORECTOMY     lt   Social History   Socioeconomic History   Marital status: Single    Spouse name: Not on file   Number of children: Not on file   Years of education: Not on file   Highest education level: Not on file  Occupational History   Not on file  Tobacco Use   Smoking status: Never    Passive exposure: Never   Smokeless tobacco: Never  Vaping Use   Vaping status: Never Used  Substance and Sexual Activity   Alcohol use: No   Drug use: No   Sexual activity: Not on file  Other Topics Concern   Not on file  Social History Narrative   Not on file   Social Drivers of Health   Financial Resource Strain: Not on file  Food Insecurity: Not on file  Transportation Needs: Not on file  Physical Activity: Not on file  Stress: Not on file  Social Connections: Not on file   Family History  Problem Relation Age of Onset   Asthma Mother    Cancer Mother    Allergic rhinitis Father    Hypertension Father    Heart attack Father    Outpatient Encounter Medications as of 11/22/2023  Medication Sig    acetaminophen  (TYLENOL ) 650 MG CR tablet Take 650 mg by mouth every 8 (eight) hours as needed for pain.   albuterol (PROVENTIL HFA;VENTOLIN HFA) 108 (90 BASE) MCG/ACT inhaler Inhale 2 puffs into the lungs every 6 (six) hours as needed.   amLODipine (NORVASC) 5 MG tablet Take 5 mg by mouth daily.   aspirin EC 81 MG tablet Take 81 mg by mouth daily. Swallow whole.   budesonide -formoterol  (SYMBICORT ) 160-4.5 MCG/ACT inhaler Inhale 2 puffs into the lungs daily.   Calcium Carbonate (CALCIUM 500 PO) Take 2 tablets by mouth daily.   calcium carbonate (TUMS - DOSED IN MG ELEMENTAL CALCIUM) 500 MG chewable tablet Chew 1 tablet by mouth as needed for indigestion or heartburn.   carboxymethylcellulose (REFRESH PLUS) 0.5 % SOLN Place 1 drop into both eyes 3 (three) times daily as needed.   cholecalciferol (VITAMIN D) 25 MCG (1000 UNIT) tablet Take 1,000 Units by mouth daily.   cycloSPORINE (RESTASIS) 0.05 % ophthalmic emulsion INSTILL  1 DROP IN [facility] EYE EVERY 12 HOURS   doxycycline  (VIBRA -TABS) 100 MG tablet Take 1 tablet (100 mg total) by mouth 2 (two) times daily.   EPINEPHrine  (EPIPEN  2-PAK) 0.3 mg/0.3 mL IJ SOAJ injection Inject 0.3 mg into the muscle as needed for anaphylaxis.   famotidine (PEPCID) 20 MG tablet Take 20 mg by mouth daily.   fexofenadine  (ALLEGRA ) 180 MG tablet Take 1 tablet (180 mg total) by mouth daily.   furosemide (LASIX) 40 MG tablet Take 40 mg by mouth daily.   levothyroxine  (SYNTHROID ) 50 MCG tablet Take 1 tablet (50 mcg total) by mouth daily before breakfast.   lidocaine  (LIDODERM ) 5 % 2 patches daily.   Menthol, Topical Analgesic, (BIOFREEZE ROLL-ON) 4 % GEL Apply topically.   montelukast (SINGULAIR) 10 MG tablet TAKE ONE TABLET BY MOUTH DAILY FOR ALLERGIES AND ASTHMA- TAKE FOR 2 WEEKS   Multiple Vitamins-Minerals (HAIR SKIN & NAILS) TABS Take 2 tablets by mouth daily in the afternoon.   pantoprazole (PROTONIX) 40 MG tablet Take 40 mg by mouth daily.   pravastatin (PRAVACHOL) 20  MG tablet Take 10 mg by mouth daily.   sodium bicarbonate 650 MG tablet Take 650 mg by mouth 2 (two) times daily.   Tiotropium Bromide Monohydrate  (SPIRIVA  RESPIMAT) 1.25 MCG/ACT AERS Inhale 2 puffs into the lungs daily.   topiramate (TOPAMAX) 100 MG tablet Take 100 mg by mouth 3 (three) times daily.   triamcinolone (NASACORT) 55 MCG/ACT AERO nasal inhaler Place 2 sprays into the nose daily.   [DISCONTINUED] levothyroxine  (SYNTHROID ) 75 MCG tablet Take 1 tablet (75 mcg total) by mouth daily before breakfast.   No facility-administered encounter medications on file as of 11/22/2023.   ALLERGIES: Allergies  Allergen Reactions   Amlodipine Swelling   Azelastine     Other Reaction(s): Bleeding from nose   Compazine [Prochlorperazine Edisylate]     Increase bp   Erythromycin Itching   Fluticasone      Other Reaction(s): Bleeding from nose   Penicillins Swelling   Voltaren [Diclofenac Sodium]     Gi upset    VACCINATION STATUS: Immunization History  Administered Date(s) Administered   Moderna Covid-19 Fall Seasonal Vaccine 64yrs & older 03/17/2022, 01/24/2023    HPI Margaret Gay is 58 y.o. female who was recently seen in consultation for multinodular goiter and hypothyroidism.  She is status post fine-needle aspiration of bilateral thyroid  nodules with benign findings in 2021.  She did have surveillance thyroid  ultrasound in March 2025 showing stable findings.    -She is currently on levothyroxine  75 mcg p.o. daily before breakfast.  She has no complaints, presents with significant intentional weight loss.  Her previsit thyroid  function tests are consistent with slight over replacement.   -She is known to have nodular goiter at least since 2013.   She denies dysphagia, shortness of breath, nor voice change.  She does not have a repeat ultrasound since 2021.   She denies palpitations, tremors, heat/cold intolerance.  She denies family history of thyroid  malignancy. She has  hyperlipidemia, not on statins but she is on omega-3 fatty acids.     Review of Systems    Objective:       11/22/2023   10:14 AM 11/15/2023   10:33 AM 10/30/2023    9:05 AM  Vitals with BMI  Height 5' 3.5 5' 3.78 5' 3.5  Weight 182 lbs 3 oz 184 lbs 3 oz 186 lbs 10 oz  BMI 31.76 31.84 32.53  Systolic 110 128 887  Diastolic 64 70 78  Pulse 84 84 63    BP 110/64   Pulse 84   Ht 5' 3.5 (1.613 m)   Wt 182 lb 3.2 oz (82.6 kg)   BMI 31.77 kg/m   Wt Readings from Last 3 Encounters:  11/22/23 182 lb 3.2 oz (82.6 kg)  11/15/23 184 lb 3.2 oz (83.6 kg)  10/30/23 186 lb 9.6 oz (84.6 kg)    Physical Exam  Constitutional:  Body mass index is 31.77 kg/m.,  not in acute distress, normal state of mind, + patient wears posture stabilizer due to her propensity for falls and disequilibrium.  She ambulates with a walker. Eyes: PERRLA, EOMI, no exophthalmos ENT: moist mucous membranes,  + gross nodular  thyromegaly, no gross cervical lymphadenopathy   CMP ( most recent) CMP     Component Value Date/Time   NA 143 01/21/2010 1648   K 3.3 (L) 01/21/2010 1648   CL 117 (H) 01/21/2010 1648   CO2 19 01/21/2010 1648   GLUCOSE 141 (H) 01/21/2010 1648   BUN 21 01/21/2010 1648   CREATININE 1.17 01/21/2010 1648   CALCIUM 8.6 01/21/2010 1648   PROT 7.4 12/22/2009 1900   ALBUMIN 4.1 12/22/2009 1900   AST 15 12/22/2009 1900   ALT 10 12/22/2009 1900   ALKPHOS 66 12/22/2009 1900   BILITOT 0.4 12/22/2009 1900   GFRNONAA 50 (L) 01/21/2010 1648   GFRAA  01/21/2010 1648    >60        The eGFR has been calculated using the MDRD equation. This calculation has not been validated in all clinical situations. eGFR's persistently <60 mL/min signify possible Chronic Kidney Disease.     Diabetic Labs (most recent): Lab Results  Component Value Date   HGBA1C 5.8 07/05/2019     Lipid Panel ( most recent) Lipid Panel     Component Value Date/Time   CHOL 195 11/20/2023 1226   TRIG 116  11/20/2023 1226   HDL 80 11/20/2023 1226   CHOLHDL 2.4 11/20/2023 1226   LDLCALC 95 11/20/2023 1226   LABVLDL 20 11/20/2023 1226    Recent Results (from the past 2160 hours)  TSH     Status: Abnormal   Collection Time: 11/20/23 12:26 PM  Result Value Ref Range   TSH 0.374 (L) 0.450 - 4.500 uIU/mL  T4, free     Status: None   Collection Time: 11/20/23 12:26 PM  Result Value Ref Range   Free T4 1.17 0.82 - 1.77 ng/dL  T3     Status: None   Collection Time: 11/20/23 12:26 PM  Result Value Ref Range   T3, Total 93 71 - 180 ng/dL  Lipid panel     Status: None   Collection Time: 11/20/23 12:26 PM  Result Value Ref Range   Cholesterol, Total 195 100 - 199 mg/dL   Triglycerides 883 0 - 149 mg/dL   HDL 80 >60 mg/dL   VLDL Cholesterol Cal 20 5 - 40 mg/dL   LDL Chol Calc (NIH) 95 0 - 99 mg/dL   Chol/HDL Ratio 2.4 0.0 - 4.4 ratio    Comment:                                   T. Chol/HDL Ratio  Men  Women                               1/2 Avg.Risk  3.4    3.3                                   Avg.Risk  5.0    4.4                                2X Avg.Risk  9.6    7.1                                3X Avg.Risk 23.4   11.0    January 09, 2020 biopsy results are benign on bilateral thyroid  nodules.  Thyroid  ultrasound on July 12, 2023 IMPRESSION: 1. No significant interval change in the size or appearance of previously biopsied nodules in the right upper and right mid gland compared to prior imaging. Assuming previously benign biopsy diagnosis, no further imaging follow-up is recommended. 2. Additional small isthmic and left mid thyroid  nodules are noted but do not meet criteria for biopsy or dedicated imaging surveillance. No follow-up imaging recommended.  Assessment & Plan:   1.  Multinodular goiter  2.  Hypothyroidism  3.  Hyperlipidemia Her thyroid  function tests are consistent with slight over replacement.  This could be due to  her significant weight loss.  I discussed and lowered her levothyroxine  to 50 mcg p.o. daily before breakfast.     - We discussed about the correct intake of her thyroid  hormone, on empty stomach at fasting, with water, separated by at least 30 minutes from breakfast and other medications,  and separated by more than 4 hours from calcium, iron, multivitamins, acid reflux medications (PPIs). -Patient is made aware of the fact that thyroid  hormone replacement is needed for life, dose to be adjusted by periodic monitoring of thyroid  function tests.   Regarding her multinodular goiter:  She is status post fine-needle aspiration of largest/concerning nodules on bilateral thyroid  lobes with benign outcomes in 2021.  Unremarkable thyroid  ultrasound findings in March 2025.  She will not need any definitive antithyroid intervention at this time.  -She will be considered for 1 more surveillance thyroid  ultrasound in March 2026.     In light of her hyperlipidemia, currently on pravastatin 10 mg p.o. nightly.  -She has also engaged in lifestyle medicine nutrition.  Her most recent lipid panel shows significant improvement with LDL at 80, triglycerides 116.   - she is advised to maintain close follow up with Hicks, Kristin D, MD for primary care needs.   I spent  21  minutes in the care of the patient today including review of labs from Thyroid  Function, CMP, and other relevant labs ; imaging/biopsy records (current and previous including abstractions from other facilities); face-to-face time discussing  her lab results and symptoms, medications doses, her options of short and long term treatment based on the latest standards of care / guidelines;   and documenting the encounter.  Harlene JONELLE Ruth  participated in the discussions, expressed understanding, and voiced agreement with the above plans.  All questions were answered to her satisfaction. she is encouraged to contact clinic should she have any questions or  concerns prior to her return visit.    Follow up plan: Return in about 6 months (around 05/24/2024) for F/U with Pre-visit Labs.   Ranny Earl, MD [facility] [address] [place], [place] [address] Phone: [phone]  Fax: [phone]     11/22/2023, 10:58 AM  This note was partially dictated with voice recognition software. Similar sounding words can be transcribed inadequately or may not  be corrected upon review.

## 2023-12-14 ENCOUNTER — Other Ambulatory Visit: Payer: Self-pay

## 2023-12-14 DIAGNOSIS — E039 Hypothyroidism, unspecified: Secondary | ICD-10-CM

## 2023-12-14 MED ORDER — LEVOTHYROXINE SODIUM 50 MCG PO TABS: 50.0000 ug | ORAL_TABLET | Freq: Every day | ORAL | 1 refills | Status: DC

## 2024-01-10 ENCOUNTER — Encounter: Attending: Internal Medicine | Admitting: Nutrition

## 2024-01-10 ENCOUNTER — Telehealth: Payer: Self-pay | Admitting: Allergy & Immunology

## 2024-01-10 ENCOUNTER — Encounter: Payer: Self-pay | Admitting: Nutrition

## 2024-01-10 VITALS — Ht 63.5 in | Wt 177.0 lb

## 2024-01-10 DIAGNOSIS — E669 Obesity, unspecified: Secondary | ICD-10-CM | POA: Diagnosis present

## 2024-01-10 DIAGNOSIS — E782 Mixed hyperlipidemia: Secondary | ICD-10-CM | POA: Insufficient documentation

## 2024-01-10 DIAGNOSIS — N183 Chronic kidney disease, stage 3 unspecified: Secondary | ICD-10-CM | POA: Insufficient documentation

## 2024-01-10 DIAGNOSIS — R7303 Prediabetes: Secondary | ICD-10-CM | POA: Insufficient documentation

## 2024-01-10 NOTE — Telephone Encounter (Signed)
 Spoke with pt and informed her current authorization expires on 01/31/2024 and a new authorization has been requested and for her to [facility] for a new [facility] as well.

## 2024-01-10 NOTE — Progress Notes (Signed)
 Medical Nutrition Therapy  Appointment Start time:  0900 Appointment End time: 0920 Primary concerns today: Pre Diabetes  Referral diagnosis: R73.03 Preferred learning style: No Preference  Learning readiness: Ready    NUTRITION ASSESSMENT Follow up  [facility]. Has cut back out eating and  being more careful of food choices. Lost 5 lbs. Working on some meal prepping but still work on progress. Staying busy with projects with her home. Eating more salmon and vegetables Is getting some massage therapy to help with her chronic pain. Feels better.  Will be going to the [place] in the next few months and will get blood work done there. Will bring to next appointment.  Anthropometrics  Wt Readings from Last 3 Encounters:  01/10/24 177 lb (80.3 kg)  11/22/23 182 lb 3.2 oz (82.6 kg)  11/15/23 184 lb 3.2 oz (83.6 kg)   Ht Readings from Last 3 Encounters:  01/10/24 5' 3.5 (1.613 m)  11/22/23 5' 3.5 (1.613 m)  11/15/23 5' 3.78 (1.62 m)   Body mass index is 30.86 kg/m. @BMIFA @ Facility age limit for growth %iles is 20 years. Facility age limit for growth %iles is 20 years.    Clinical Medical Hx: Multinodular thyroid , Hyperlipidemia, elevated BS. Medications: See chart Labs: Renal: Cret 1.32 mg/dl, eGFR 47 mg/dl. A1C 6.1% 12/22.  Lipid Panel     Component Value Date/Time   CHOL 195 11/20/2023 1226   TRIG 116 11/20/2023 1226   HDL 80 11/20/2023 1226   CHOLHDL 2.4 11/20/2023 1226   LDLCALC 95 11/20/2023 1226   LABVLDL 20 11/20/2023 1226   . Notable Signs/Symptoms: Weight gain,   Lifestyle & Dietary Hx LIves in a house with a friend.  Estimated daily fluid intake: 40 oz Supplements:  Sleep: 4-5 hrs Stress / self-care: None issues. Current average weekly physical activity: walk a little   24-Hr Dietary Recall First Meal:Oatmeal and [place] sausage, tea Snack: banana Eating more salmon and vegetables and fruit   Snack:  Beverages: water  Estimated Energy Needs Calories: 1200 Carbohydrate: 135g Protein: 90g Fat: 33g   NUTRITION DIAGNOSIS  NI-1.7 Predicted excessive energy intake As related to excess calorie and CHO intake.  As evidenced by A1C 6.1% and BMI 35 and only eating 2 meals per day with less activity.SABRA   NUTRITION INTERVENTION  Nutrition education (E-1) on the following topics:  Meal planning and meal prepping.   Lifestyle Medicine  - Whole Food, Plant Predominant Nutrition is highly recommended: Eat Plenty of vegetables, Mushrooms, fruits, Legumes, Whole Grains, Nuts, seeds in lieu of processed meats, processed snacks/pastries red meat, poultry, eggs.    -It is better to avoid simple carbohydrates including: Cakes, Sweet Desserts, Ice Cream, Soda (diet and regular), Sweet Tea, Candies, Chips, Cookies, Store Bought Juices, Alcohol in Excess of  1-2 drinks a day, Lemonade,  Artificial Sweeteners, Doughnuts, Coffee Creamers, Sugar-free Products, etc, etc.  This is not a complete list.....  Exercise: If you are able: 30 -60 minutes a day ,4 days a week, or 150 minutes a week.  The longer the better.  Combine stretch, strength, and aerobic activities.  If you were told in the past that you have high risk for cardiovascular diseases, you may seek evaluation by your heart doctor prior to initiating moderate to intense exercise programs.   Handouts Provided Include  Lifestyle Medicine Handouts  Learning Style & Readiness for Change Teaching method utilized: Visual & Auditory  Demonstrated degree of  understanding via: Teach Back  Barriers to learning/adherence to lifestyle change: Physical limitations with her back brace, knee brace and other medical conditions.  Goals Established by Pt  Goal  Talk to the [facility] about the silver sneaker classes  MONITORING & EVALUATION Dietary intake, weekly physical activity, and weight in 3 months.  Next Steps  Patient is to work on meal planning and  eating more whole plant based foods. SABRA

## 2024-01-10 NOTE — Patient Instructions (Signed)
 Goal  Talk to the [facility] about the silver sneaker classes

## 2024-01-11 NOTE — Telephone Encounter (Signed)
 Pt [facility] Referral has been received has been extended.

## 2024-01-24 ENCOUNTER — Encounter: Payer: Self-pay | Admitting: Allergy & Immunology

## 2024-01-24 ENCOUNTER — Ambulatory Visit (INDEPENDENT_AMBULATORY_CARE_PROVIDER_SITE_OTHER): Admitting: Allergy & Immunology

## 2024-01-24 ENCOUNTER — Other Ambulatory Visit: Payer: Self-pay

## 2024-01-24 VITALS — BP 120/70 | HR 97 | Temp 97.3°F | Resp 18 | Ht 64.57 in | Wt 176.2 lb

## 2024-01-24 DIAGNOSIS — B999 Unspecified infectious disease: Secondary | ICD-10-CM | POA: Diagnosis not present

## 2024-01-24 DIAGNOSIS — J302 Other seasonal allergic rhinitis: Secondary | ICD-10-CM

## 2024-01-24 DIAGNOSIS — J3089 Other allergic rhinitis: Secondary | ICD-10-CM

## 2024-01-24 DIAGNOSIS — J4540 Moderate persistent asthma, uncomplicated: Secondary | ICD-10-CM

## 2024-01-24 MED ORDER — PREDNISONE 10 MG PO TABS: ORAL_TABLET | ORAL | 0 refills | Status: AC

## 2024-01-24 MED ORDER — MONTELUKAST SODIUM 10 MG PO TABS: 10.0000 mg | ORAL_TABLET | Freq: Every day | ORAL | 1 refills | Status: AC

## 2024-01-24 MED ORDER — SPIRIVA RESPIMAT 1.25 MCG/ACT IN AERS: 2.0000 | INHALATION_SPRAY | Freq: Every day | RESPIRATORY_TRACT | 1 refills | Status: AC

## 2024-01-24 MED ORDER — BUDESONIDE-FORMOTEROL FUMARATE 160-4.5 MCG/ACT IN AERO: 2.0000 | INHALATION_SPRAY | Freq: Every day | RESPIRATORY_TRACT | 5 refills | Status: DC

## 2024-01-24 NOTE — Progress Notes (Signed)
 FOLLOW UP  Date of Service/Encounter:  01/24/24   Assessment:   Recurrent infections with poor response to pneumococcal titers - [facility]   Moderate persistent asthma, uncomplicated   Perennial and seasonal allergic rhinitis (dust mites, cockroach, mountain cedar, ragweed, weeds)   Polypoid mucosal thickening of right maxillary sinus and left maxillary sinus and sinus CT (April 2024)   Complicated past medication history, including fibromyalgia  Plan/Recommendations:   1. Recurrent infections - multiple episodes of pneumonia - You were only protective to 7/23 serotypes of Streptococcus pneumoniae. - Since your last pneumonia shot was in 2008, we need you to get another one nose and then we can recheck your Streptococcal titers. - You can get the PNEUMOVAX or the PREVNAR-20.  - You can get this as long as you wait 48 hours before an allergy  shot!  - This will help determine whether or not we can start immunoglobulin replacement or prophylactic (preventative) antibiotics.  - Prophylactic antibiotics are taken daily to prevent infections. - Immunoglobulin replacement consists of infusions (weekly, biweekly, or monthly) to boost your immune system and prevent you from getting infections.  - Learning more about specific antibody deficiency here: https://primaryimmune.org/understanding-primary-immunodeficiency/types-of-pi/specific-antibody-deficiency  2. Moderate persistent asthma, uncomplicated - Lung testing looks lower today. - Start a prednisone  burst.  - Daily controller medication(s): Symbicort  160/4.58mcg two puffs twice daily with spacer and Spiriva  1.25mcg two puffs once daily - Prior to physical activity: albuterol 2 puffs 10-15 minutes before physical activity. - Rescue medications: albuterol 4 puffs every 4-6 hours as needed - Asthma control goals:  * Full participation in all desired activities (may need albuterol before  activity) * Albuterol use two time or less a week on average (not counting use with activity) * Cough interfering with sleep two time or less a month * Oral steroids no more than once a year * No hospitalizations  3. Chronic rhinitis (dust mites, cockroach, mountain cedar, ragweed, weeds) - Continue with the allergy  shots at the same schedule.  - Continue with Allegra  (fexofenadine ) 180 mg daily. - Continue with Nasacort one spray per nostril twice daily. - Allergy  shots restarted today.   4. Return in about 2 months (around 03/25/2024). You can have the follow up appointment with Dr. Iva or a Nurse Practicioner (our Nurse Practitioners are excellent and always have Physician oversight!).    Subjective:   Margaret Gay is a 58 y.o. female presenting today for follow up of  Chief Complaint  Patient presents with   Follow-up    Margaret Gay has a history of the following: Patient Active Problem List   Diagnosis Date Noted   Moderate persistent asthma, uncomplicated 11/15/2023   Recurrent infections 11/15/2023   Seasonal and perennial allergic rhinitis 11/15/2023   Acute recurrent maxillary sinusitis 11/15/2023   Mixed hyperlipidemia 08/12/2021   Multinodular thyroid  12/17/2019   Hypothyroidism 12/17/2019    History obtained from: chart review and patient.  Discussed the use of AI scribe software for clinical note transcription with the patient and/or guardian, who gave verbal consent to proceed.  Margaret Gay is a 58 y.o. female presenting for a follow up visit.  She was last seen in July 2025.  At that time, we continue with Symbicort  160 mcg 2 puffs twice daily as well as Spiriva  2 puffs once a day.  For her acute sinusitis, she was started on doxycycline  as well as Mucinex.  Her allergic rhinitis, she was continued on allergy   shots.  It was recommended she get Pneumovax and repeat titers in 4 to 6 weeks.  Since last visit, she has done relatively well. She was diagnosed with  COVID since we saw her last time. She was given a cough medicine for this. It worked somewhat well. She has had a lot going on. She is now back to normal for the most part. She is having some congestion.   Asthma/Respiratory Symptom History: She has not experienced any significant breathing issues since her last visit. She continues to use Symbicort  and Spiriva , both covered by the [place], and has not needed to use her emergency inhaler frequently. She is using her CPAP machine intermittently and used it for about an hour last night. No cough and no frequent use of her emergency inhaler.   She was diagnosed with COVID-19 in the past but has since recovered without requiring specific medication. No current cough and no other family members contracted COVID-19. She is uncertain about where she contracted the virus.  Allergic Rhinitis Symptom History: Regarding her allergies, she continues to use a nasal spray and Allegra  for symptom management. She has not yet received the latest long-acting shot. She has purchased Nasacort over-the-counter for additional relief. She is interested in restarting her allergy  shots. She has not received them in a couple of months because of recurrent illnesses.    Infection Symptom History: She has not gotten her pneumonia shot as of yet. She is going to talk to her PCP to get this done. She has had some infections since we saw her last time.   Otherwise, there have been no changes to her past medical history, surgical history, family history, or social history.    Review of systems otherwise negative other than that mentioned in the HPI.    Objective:   Blood pressure 120/70, pulse 97, temperature (!) 97.3 F (36.3 C), temperature source Temporal, resp. rate 18, height 5' 4.57 (1.64 m), weight 176 lb 3.2 oz (79.9 kg), SpO2 98%. Body mass index is 29.72 kg/m.    Physical Exam Vitals reviewed.  Constitutional:      Appearance: She is well-developed.      Comments: Talkative. Very lovely.  HENT:     Head: Normocephalic and atraumatic.     Right Ear: Tympanic membrane, ear canal and external ear normal. No drainage, swelling or tenderness. Tympanic membrane is not injected, scarred, erythematous, retracted or bulging.     Left Ear: Tympanic membrane, ear canal and external ear normal. No drainage, swelling or tenderness. Tympanic membrane is not injected, scarred, erythematous, retracted or bulging.     Nose: No nasal deformity, septal deviation or mucosal edema.     Right Turbinates: Enlarged, swollen and pale.     Left Turbinates: Enlarged, swollen and pale.     Right Sinus: No maxillary sinus tenderness or frontal sinus tenderness.     Left Sinus: No maxillary sinus tenderness or frontal sinus tenderness.     Mouth/Throat:     Lips: Pink.     Mouth: Mucous membranes are moist. Mucous membranes are not pale and not dry.     Pharynx: Uvula midline.  Eyes:     General: Lids are normal. Allergic shiner present.        Right eye: No discharge.        Left eye: No discharge.     Conjunctiva/sclera: Conjunctivae normal.     Right eye: Right conjunctiva is not injected. No chemosis.    Left eye:  Left conjunctiva is not injected. No chemosis.    Pupils: Pupils are equal, round, and reactive to light.  Cardiovascular:     Rate and Rhythm: Normal rate and regular rhythm.     Heart sounds: Normal heart sounds.  Pulmonary:     Effort: Pulmonary effort is normal. No tachypnea, accessory muscle usage or respiratory distress.     Breath sounds: Normal breath sounds. No wheezing, rhonchi or rales.     Comments: Moving air well in all lung fields. No increased work of breathing noted.  Chest:     Chest wall: No tenderness.  Lymphadenopathy:     Head:     Right side of head: No submandibular, tonsillar or occipital adenopathy.     Left side of head: No submandibular, tonsillar or occipital adenopathy.     Cervical: No cervical adenopathy.  Skin:     Coloration: Skin is not pale.     Findings: No abrasion, erythema, petechiae or rash. Rash is not papular, urticarial or vesicular.  Neurological:     Mental Status: She is alert.  Psychiatric:        Behavior: Behavior is cooperative.      Diagnostic studies:    Spirometry: results abnormal (FEV1: 0.77/37%, FVC: 1.38/53%, FEV1/FVC: 56%).    Spirometry consistent with mixed obstructive and restrictive disease. Albuterol four puffs via MDI treatment given in clinic with significant improvement in FEV1 and FVC per ATS criteria.  Allergy  Studies: none       Marty Shaggy, MD  [facility] 

## 2024-01-24 NOTE — Patient Instructions (Addendum)
 1. Recurrent infections - multiple episodes of pneumonia - You were only protective to 7/23 serotypes of Streptococcus pneumoniae. - Since your last pneumonia shot was in 2008, we need you to get another one nose and then we can recheck your Streptococcal titers. - You can get the PNEUMOVAX or the PREVNAR-20.  - You can get this as long as you wait 48 hours before an allergy  shot!  - This will help determine whether or not we can start immunoglobulin replacement or prophylactic (preventative) antibiotics.  - Prophylactic antibiotics are taken daily to prevent infections. - Immunoglobulin replacement consists of infusions (weekly, biweekly, or monthly) to boost your immune system and prevent you from getting infections.  - Learning more about specific antibody deficiency here: https://primaryimmune.org/understanding-primary-immunodeficiency/types-of-pi/specific-antibody-deficiency  2. Moderate persistent asthma, uncomplicated - Lung testing looks lower today. - Start a prednisone  burst.  - Daily controller medication(s): Symbicort  160/4.25mcg two puffs twice daily with spacer and Spiriva  1.62mcg two puffs once daily - Prior to physical activity: albuterol 2 puffs 10-15 minutes before physical activity. - Rescue medications: albuterol 4 puffs every 4-6 hours as needed - Asthma control goals:  * Full participation in all desired activities (may need albuterol before activity) * Albuterol use two time or less a week on average (not counting use with activity) * Cough interfering with sleep two time or less a month * Oral steroids no more than once a year * No hospitalizations  3. Chronic rhinitis (dust mites, cockroach, mountain cedar, ragweed, weeds) - Continue with the allergy  shots at the same schedule.  - Continue with Allegra  (fexofenadine ) 180 mg daily. - Continue with Nasacort one spray per nostril twice daily. - Allergy  shots restarted today.   4. Return in about 2 months (around  03/25/2024). You can have the follow up appointment with Dr. Iva or a Nurse Practicioner (our Nurse Practitioners are excellent and always have Physician oversight!).    Please inform us  of any Emergency Department visits, hospitalizations, or changes in symptoms. Call us  before going to the ED for breathing or allergy  symptoms since we might be able to fit you in for a sick visit. Feel free to contact us  anytime with any questions, problems, or concerns.  It was a pleasure to see you again today!  Websites that have reliable patient information: 1. American Academy of Asthma, Allergy , and Immunology: www.aaaai.org 2. Food Allergy  Research and Education (FARE): foodallergy.org 3. Mothers of Asthmatics: http://www.asthmacommunitynetwork.org 4. [employer] of Allergy , Asthma, and Immunology: www.acaai.org      "Like" us  on Facebook and Instagram for our latest updates!      A healthy democracy works best when [employer] participate! Make sure you are registered to vote! If you have moved or changed any of your contact information, you will need to get this updated before voting! Scan the QR codes below to learn more!

## 2024-03-05 ENCOUNTER — Other Ambulatory Visit: Payer: Self-pay | Admitting: Allergy & Immunology

## 2024-03-27 ENCOUNTER — Ambulatory Visit: Payer: Self-pay

## 2024-03-27 ENCOUNTER — Ambulatory Visit (INDEPENDENT_AMBULATORY_CARE_PROVIDER_SITE_OTHER): Admitting: Allergy & Immunology

## 2024-03-27 ENCOUNTER — Encounter: Payer: Self-pay | Admitting: Allergy & Immunology

## 2024-03-27 VITALS — BP 110/70 | HR 90 | Temp 97.8°F | Resp 18 | Ht 60.0 in | Wt 173.2 lb

## 2024-03-27 DIAGNOSIS — J4540 Moderate persistent asthma, uncomplicated: Secondary | ICD-10-CM | POA: Diagnosis not present

## 2024-03-27 DIAGNOSIS — J3089 Other allergic rhinitis: Secondary | ICD-10-CM | POA: Diagnosis not present

## 2024-03-27 DIAGNOSIS — D806 Antibody deficiency with near-normal immunoglobulins or with hyperimmunoglobulinemia: Secondary | ICD-10-CM

## 2024-03-27 DIAGNOSIS — J302 Other seasonal allergic rhinitis: Secondary | ICD-10-CM

## 2024-03-27 DIAGNOSIS — J309 Allergic rhinitis, unspecified: Secondary | ICD-10-CM

## 2024-03-27 DIAGNOSIS — J0101 Acute recurrent maxillary sinusitis: Secondary | ICD-10-CM

## 2024-03-27 MED ORDER — ALBUTEROL SULFATE (2.5 MG/3ML) 0.083% IN NEBU: 2.5000 mg | INHALATION_SOLUTION | Freq: Four times a day (QID) | RESPIRATORY_TRACT | 1 refills | Status: AC | PRN

## 2024-03-27 MED ORDER — DOXYCYCLINE HYCLATE 100 MG PO TABS: 100.0000 mg | ORAL_TABLET | Freq: Two times a day (BID) | ORAL | 0 refills | Status: AC

## 2024-03-27 NOTE — Progress Notes (Signed)
 FOLLOW UP  Date of Service/Encounter:  03/27/24   Assessment:   Recurrent infections with poor response to pneumococcal titers - [facility]   Moderate persistent asthma, uncomplicated   Perennial and seasonal allergic rhinitis (dust mites, cockroach, mountain cedar, ragweed, weeds)   Polypoid mucosal thickening of right maxillary sinus and left maxillary sinus and sinus CT (April 2024)   Complicated past medication history, including fibromyalgia  Plan/Recommendations:   1. Recurrent infections - multiple episodes of pneumonia - You were only protective to 7/23 serotypes of Streptococcus pneumoniae. - Since your last pneumonia shot was in 2008, we need you to get another one nose and then we can recheck your Streptococcal titers. - You can get the PNEUMOVAX or the PREVNAR-20.  - You can get this as long as you wait 48 hours before an allergy  shot!  - This will help determine whether or not we can start immunoglobulin replacement or prophylactic (preventative) antibiotics.  - Prophylactic antibiotics are taken daily to prevent infections. - Immunoglobulin replacement consists of infusions (weekly, biweekly, or monthly) to boost your immune system and prevent you from getting infections.  - Learning more about specific antibody deficiency here: https://primaryimmune.org/understanding-primary-immunodeficiency/types-of-pi/specific-antibody-deficiency  2. Acute sinusitis - Start doxycycline  100mg  twice daily for ten days. - Use Mucinex to help with clearance of your sinuses.   3. Moderate persistent asthma, uncomplicated - Lung testing looks lower today. - Nebulizer machine provided today. - I might start you on injectable medication to help with your asthma.  - Daily controller medication(s): Symbicort  160/4.71mcg two puffs twice daily with spacer and Spiriva  1.25mcg two puffs once daily - Prior to physical activity: albuterol  2  puffs 10-15 minutes before physical activity. - Rescue medications: albuterol  4 puffs every 4-6 hours as needed - Asthma control goals:  * Full participation in all desired activities (may need albuterol  before activity) * Albuterol  use two time or less a week on average (not counting use with activity) * Cough interfering with sleep two time or less a month * Oral steroids no more than once a year * No hospitalizations  4. Chronic rhinitis (dust mites, cockroach, mountain cedar, ragweed, weeds) - Continue with the allergy  shots at the same schedule.  - Continue with Allegra  (fexofenadine ) 180 mg daily. - Continue with Nasacort one spray per nostril twice daily. - Allergy  shots restarted today.   4. Return in about 2 months (around 05/27/2024). You can have the follow up appointment with Dr. Iva or a Nurse Practicioner (our Nurse Practitioners are excellent and always have Physician oversight!).   Subjective:   Margaret Gay is a 58 y.o. female presenting today for follow up of  Chief Complaint  Patient presents with   Follow-up    Pt presents to the office for a 2 month follow up. Patient states that she has been having sinus infection symptoms: few sweats, sneezing, congestion x few days.    Margaret Gay has a history of the following: Patient Active Problem List   Diagnosis Date Noted   Moderate persistent asthma, uncomplicated 11/15/2023   Recurrent infections 11/15/2023   Seasonal and perennial allergic rhinitis 11/15/2023   Acute recurrent maxillary sinusitis 11/15/2023   Mixed hyperlipidemia 08/12/2021   Multinodular thyroid  12/17/2019   Hypothyroidism 12/17/2019    History obtained from: chart review and patient.  Discussed the use of AI scribe software for clinical note transcription with the patient and/or guardian, who gave verbal consent to proceed.  Margaret Gay is a 58 y.o. female presenting for a follow up visit.  She was last seen in September 2025.  At that  time, she would like checked up to 7 out of 23 serotypes of Streptococcus pneumonia.  We recommended that she get a Pneumovax or Prevnar 20. Lung testing looked a bit lower.  We started her on prednisone  as well as Symbicort  160 mcg 2 puffs twice daily and Spiriva .  For her rhinitis, she continue with allergy  shots as well as Allegra  and Nasacort.  Since the last visit, she has been around the same.   Sinus symptoms began a few days ago, characterized by sinus drainage.   Asthma/Respiratory Symptom History: She remains on the Symbicort  two puffs twice daily every day. She is using Spiriva  as well. She does report some fatigue and is not sure why she has been so tired. She is using her CPAP around once or twice per week. She feels tired and weak, and has been trying to use her CPAP more frequently. She does not have a nebulizer at home. She experiences tightness in her chest and feels weak.  Allergic Rhinitis Symptom History: She has been having sinus drainage for a period of time a few days. She has been having some sinus pain and pressure.   It does not look like she continued with her allergy  shots after last time we saw her. She had to go out of town and had some procedures done, which stopped her from coming back. She had a spinal surgery. 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 She had hypotension during the procedure.   Infection Symptom History: She has a history of sinus infections and bronchitis, with the last episode of bronchitis occurring last year.   Her past medical history includes mitral valve prolapse, for which she does not take antibiotics before dental procedures. She also mentions having high cholesterol that she needs to manage. She lives a considerable distance from her primary doctor and travels to [facility] for massage therapy two to three times a month.   Otherwise, there have been no changes to her past medical history, surgical  history, family history, or social history.    Review of systems otherwise negative other than that mentioned in the HPI.    Objective:   Blood pressure 110/70, pulse 90, temperature 97.8 F (36.6 C), temperature source Temporal, resp. rate 18, height 5' (1.524 m), weight 173 lb 3.2 oz (78.6 kg), SpO2 90%. Body mass index is 33.83 kg/m.    Physical Exam Vitals reviewed.  Constitutional:      Appearance: She is well-developed.     Comments: Talkative. Very lovely.  HENT:     Head: Normocephalic and atraumatic.     Right Ear: Tympanic membrane, ear canal and external ear normal. No drainage, swelling or tenderness. Tympanic membrane is not injected, scarred, erythematous, retracted or bulging.     Left Ear: Tympanic membrane, ear canal and external ear normal. No drainage, swelling or tenderness. Tympanic membrane is not injected, scarred, erythematous, retracted or bulging.     Nose: No nasal deformity, septal deviation or mucosal edema.     Right Turbinates: Enlarged, swollen and pale.     Left Turbinates: Enlarged, swollen and pale.     Right Sinus: No maxillary sinus tenderness or frontal sinus tenderness.     Left Sinus: No maxillary sinus tenderness or frontal sinus tenderness.     Comments: No polyps noted.  Mouth/Throat:     Lips: Pink.     Mouth: Mucous membranes are moist. Mucous membranes are not pale and not dry.     Pharynx: Uvula midline.     Comments: Mild cobblestoning noted.  Eyes:     General: Lids are normal. Allergic shiner present.        Right eye: No discharge.        Left eye: No discharge.     Conjunctiva/sclera: Conjunctivae normal.     Right eye: Right conjunctiva is not injected. No chemosis.    Left eye: Left conjunctiva is not injected. No chemosis.    Pupils: Pupils are equal, round, and reactive to light.  Cardiovascular:     Rate and Rhythm: Normal rate and regular rhythm.     Heart sounds: Normal heart sounds.  Pulmonary:     Effort:  Pulmonary effort is normal. No tachypnea, accessory muscle usage or respiratory distress.     Breath sounds: Normal breath sounds. No wheezing, rhonchi or rales.     Comments: Moving air well in all lung fields. No increased work of breathing noted.  Chest:     Chest wall: No tenderness.  Lymphadenopathy:     Head:     Right side of head: No submandibular, tonsillar or occipital adenopathy.     Left side of head: No submandibular, tonsillar or occipital adenopathy.     Cervical: No cervical adenopathy.  Skin:    General: Skin is warm.     Capillary Refill: Capillary refill takes less than 2 seconds.     Coloration: Skin is not pale.     Findings: No abrasion, erythema, petechiae or rash. Rash is not papular, urticarial or vesicular.  Neurological:     Mental Status: She is alert.  Psychiatric:        Behavior: Behavior is cooperative.      Diagnostic studies:    Spirometry: results normal (FEV1: 0.93/45%, FVC: 1.63/63%, FEV1/FVC: 57%).    Spirometry consistent with moderate obstructive disease.   Allergy  Studies: none       Marty Shaggy, MD  [facility] 

## 2024-03-27 NOTE — Patient Instructions (Addendum)
 1. Recurrent infections - multiple episodes of pneumonia - You were only protective to 7/23 serotypes of Streptococcus pneumoniae. - Since your last pneumonia shot was in 2008, we need you to get another one nose and then we can recheck your Streptococcal titers. - You can get the PNEUMOVAX or the PREVNAR-20.  - You can get this as long as you wait 48 hours before an allergy  shot!  - This will help determine whether or not we can start immunoglobulin replacement or prophylactic (preventative) antibiotics.  - Prophylactic antibiotics are taken daily to prevent infections. - Immunoglobulin replacement consists of infusions (weekly, biweekly, or monthly) to boost your immune system and prevent you from getting infections.  - Learning more about specific antibody deficiency here: https://primaryimmune.org/understanding-primary-immunodeficiency/types-of-pi/specific-antibody-deficiency  2. Acute sinusitis - Start doxycycline  100mg  twice daily for ten days. - Use Mucinex to help with clearance of your sinuses.   3. Moderate persistent asthma, uncomplicated - Lung testing looks lower today. - Nebulizer machine provided today. - I might start you on injectable medication to help with your asthma.  - Daily controller medication(s): Symbicort  160/4.50mcg two puffs twice daily with spacer and Spiriva  1.25mcg two puffs once daily - Prior to physical activity: albuterol  2 puffs 10-15 minutes before physical activity. - Rescue medications: albuterol  4 puffs every 4-6 hours as needed - Asthma control goals:  * Full participation in all desired activities (may need albuterol  before activity) * Albuterol  use two time or less a week on average (not counting use with activity) * Cough interfering with sleep two time or less a month * Oral steroids no more than once a year * No hospitalizations  4. Chronic rhinitis (dust mites, cockroach, mountain cedar, ragweed, weeds) - Continue with the allergy  shots at  the same schedule.  - Continue with Allegra  (fexofenadine ) 180 mg daily. - Continue with Nasacort one spray per nostril twice daily. - Allergy  shots restarted today.   4. Return in about 2 months (around 05/27/2024). You can have the follow up appointment with Dr. Iva or a Nurse Practicioner (our Nurse Practitioners are excellent and always have Physician oversight!).    Please inform us  of any Emergency Department visits, hospitalizations, or changes in symptoms. Call us  before going to the ED for breathing or allergy  symptoms since we might be able to fit you in for a sick visit. Feel free to contact us  anytime with any questions, problems, or concerns.  It was a pleasure to see you again today!  Websites that have reliable patient information: 1. American Academy of Asthma, Allergy , and Immunology: www.aaaai.org 2. Food Allergy  Research and Education (FARE): foodallergy.org 3. Mothers of Asthmatics: http://www.asthmacommunitynetwork.org 4. American College of Allergy , Asthma, and Immunology: www.acaai.org      "Like" us  on Facebook and Instagram for our latest updates!      A healthy democracy works best when [employer] participate! Make sure you are registered to vote! If you have moved or changed any of your contact information, you will need to get this updated before voting! Scan the QR codes below to learn more!

## 2024-03-27 NOTE — Progress Notes (Signed)
 Immunotherapy   Patient Details  Name: KATHALEEN DUDZIAK MRN: [id number] Date of Birth: 10/11/1965  03/27/2024  Harlene JONELLE Ruth started injections for  weeds-t-dm Following schedule: B  Frequency:2 times per week Epi-Pen:Epi-Pen Available  Consent signed and patient instructions given. Patient waited 30 minutes in office.   Ladavion Savitz E Harleen Fineberg 03/27/2024, 10:43 AM

## 2024-04-11 ENCOUNTER — Other Ambulatory Visit: Payer: Self-pay | Admitting: Allergy & Immunology

## 2024-05-07 LAB — T4, FREE: Free T4: 1.07 ng/dL (ref 0.82–1.77)

## 2024-05-07 LAB — TSH: TSH: 0.436 u[IU]/mL — ABNORMAL LOW (ref 0.450–4.500)

## 2024-05-15 ENCOUNTER — Encounter: Payer: Self-pay | Admitting: "Endocrinology

## 2024-05-15 ENCOUNTER — Ambulatory Visit (INDEPENDENT_AMBULATORY_CARE_PROVIDER_SITE_OTHER): Admitting: "Endocrinology

## 2024-05-15 ENCOUNTER — Encounter: Attending: Internal Medicine | Admitting: Nutrition

## 2024-05-15 VITALS — BP 112/64 | HR 82 | Ht 60.5 in | Wt 176.6 lb

## 2024-05-15 VITALS — Ht 63.5 in | Wt 176.0 lb

## 2024-05-15 DIAGNOSIS — R7303 Prediabetes: Secondary | ICD-10-CM | POA: Insufficient documentation

## 2024-05-15 DIAGNOSIS — E042 Nontoxic multinodular goiter: Secondary | ICD-10-CM

## 2024-05-15 DIAGNOSIS — E039 Hypothyroidism, unspecified: Secondary | ICD-10-CM

## 2024-05-15 DIAGNOSIS — E782 Mixed hyperlipidemia: Secondary | ICD-10-CM

## 2024-05-15 MED ORDER — LEVOTHYROXINE SODIUM 50 MCG PO TABS: 50.0000 ug | ORAL_TABLET | Freq: Every day | ORAL | 1 refills | Status: AC

## 2024-05-15 NOTE — Progress Notes (Signed)
 Medical Nutrition Therapy  Appointment Start time:  0900 Appointment End time: 0920 Primary concerns today: Pre Diabetes  Referral diagnosis: R73.03 Preferred learning style: No Preference  Learning readiness: Ready    NUTRITION ASSESSMENT Follow up Cut down on a high fast  and seafood at [facility]' A1C 6.0% from [place] medical provider. Cholesterol was higher she reports. Working on eating more whole plant based foods Feels better. Working on eating more whole plant based foods and cooking more at home.  Anthropometrics  Wt Readings from Last 3 Encounters:  05/15/24 176 lb 9.6 oz (80.1 kg)  03/27/24 173 lb 3.2 oz (78.6 kg)  01/24/24 176 lb 3.2 oz (79.9 kg)   Ht Readings from Last 3 Encounters:  05/15/24 5' 0.5 (1.537 m)  03/27/24 5' (1.524 m)  01/24/24 5' 4.57 (1.64 m)   There is no height or weight on file to calculate BMI. @BMIFA @ Facility age limit for growth %iles is 20 years. Facility age limit for growth %iles is 20 years.  Medications Ordered Prior to Encounter[1]   Clinical Medical Hx: Multinodular thyroid , Hyperlipidemia, elevated BS. Medications: See chart Labs: Renal: Cret 1.32 mg/dl, eGFR 47 mg/dl. A1C 6.1% 12/22.  Lipid Panel     Component Value Date/Time   CHOL 195 11/20/2023 1226   TRIG 116 11/20/2023 1226   HDL 80 11/20/2023 1226   CHOLHDL 2.4 11/20/2023 1226   LDLCALC 95 11/20/2023 1226   LABVLDL 20 11/20/2023 1226   . Notable Signs/Symptoms: Weight gain,   Lifestyle & Dietary Hx LIves in a house with a friend.  Estimated daily fluid intake: 40 oz Supplements:  Sleep: 4-5 hrs Stress / self-care: None issues. Current average weekly physical activity: walk a little   24-Hr Dietary Recall Trying to eat 3 better balanced meals.  Estimated Energy Needs Calories: 1200 Carbohydrate: 135g Protein: 90g Fat: 33g   NUTRITION DIAGNOSIS  NI-1.7 Predicted excessive energy intake As related to excess calorie and CHO intake.  As evidenced by  A1C 6.1% and BMI 35 and only eating 2 meals per day with less activity.SABRA   NUTRITION INTERVENTION  Nutrition education (E-1) on the following topics:  Meal planning and meal prepping.   Lifestyle Medicine  - Whole Food, Plant Predominant Nutrition is highly recommended: Eat Plenty of vegetables, Mushrooms, fruits, Legumes, Whole Grains, Nuts, seeds in lieu of processed meats, processed snacks/pastries red meat, poultry, eggs.    -It is better to avoid simple carbohydrates including: Cakes, Sweet Desserts, Ice Cream, Soda (diet and regular), Sweet Tea, Candies, Chips, Cookies, Store Bought Juices, Alcohol in Excess of  1-2 drinks a day, Lemonade,  Artificial Sweeteners, Doughnuts, Coffee Creamers, Sugar-free Products, etc, etc.  This is not a complete list.....  Exercise: If you are able: 30 -60 minutes a day ,4 days a week, or 150 minutes a week.  The longer the better.  Combine stretch, strength, and aerobic activities.  If you were told in the past that you have high risk for cardiovascular diseases, you may seek evaluation by your heart doctor prior to initiating moderate to intense exercise programs.   Handouts Provided Include  Lifestyle Medicine Handouts  Learning Style & Readiness for Change Teaching method utilized: Visual & Auditory  Demonstrated degree of understanding via: Teach Back  Barriers to learning/adherence to lifestyle change: Physical limitations with her back brace, knee brace and other medical conditions.  Goals Established by Pt  Goal  Talk to the [facility] about the silver sneaker classes  MONITORING & EVALUATION  Dietary intake, weekly physical activity, and weight in 3 months.  Next Steps  Patient is to work on meal planning and eating more whole plant based foods. .     [1]  Current Outpatient Medications on File Prior to Visit  Medication Sig Dispense Refill   acetaminophen  (TYLENOL ) 650 MG CR tablet Take 650 mg by mouth every 8 (eight) hours as  needed for pain.     albuterol  (PROVENTIL  HFA;VENTOLIN  HFA) 108 (90 BASE) MCG/ACT inhaler Inhale 2 puffs into the lungs every 6 (six) hours as needed.     albuterol  (PROVENTIL ) (2.5 MG/3ML) 0.083% nebulizer solution Take 3 mLs (2.5 mg total) by nebulization every 6 (six) hours as needed for wheezing or shortness of breath. 75 mL 1   amLODipine (NORVASC) 5 MG tablet Take 5 mg by mouth daily.     aspirin EC 81 MG tablet Take 81 mg by mouth daily. Swallow whole.     BREYNA  160-4.5 MCG/ACT inhaler INHALE 2 PUFFS BY MOUTH ONCE DAILY 11 g 0   Calcium Carbonate (CALCIUM 500 PO) Take 2 tablets by mouth daily.     calcium carbonate (TUMS - DOSED IN MG ELEMENTAL CALCIUM) 500 MG chewable tablet Chew 1 tablet by mouth as needed for indigestion or heartburn.     carboxymethylcellulose (REFRESH PLUS) 0.5 % SOLN Place 1 drop into both eyes 3 (three) times daily as needed.     cholecalciferol (VITAMIN D) 25 MCG (1000 UNIT) tablet Take 1,000 Units by mouth daily.     cycloSPORINE (RESTASIS) 0.05 % ophthalmic emulsion INSTILL 1 DROP IN EACH EYE EVERY 12 HOURS     EPINEPHrine  (EPIPEN  2-PAK) 0.3 mg/0.3 mL IJ SOAJ injection Inject 0.3 mg into the muscle as needed for anaphylaxis. 2 each 1   famotidine (PEPCID) 20 MG tablet Take 20 mg by mouth daily.     fexofenadine  (ALLEGRA ) 180 MG tablet Take 1 tablet (180 mg total) by mouth daily. 30 tablet 5   furosemide (LASIX) 40 MG tablet Take 40 mg by mouth daily.     levothyroxine  (SYNTHROID ) 50 MCG tablet Take 1 tablet (50 mcg total) by mouth daily before breakfast. 90 tablet 1   lidocaine  (LIDODERM ) 5 % 2 patches daily.     Menthol, Topical Analgesic, (BIOFREEZE ROLL-ON) 4 % GEL Apply topically.     montelukast  (SINGULAIR ) 10 MG tablet Take 1 tablet (10 mg total) by mouth at bedtime. 90 tablet 1   Multiple Vitamins-Minerals (HAIR SKIN & NAILS) TABS Take 2 tablets by mouth daily in the afternoon.     pantoprazole (PROTONIX) 40 MG tablet Take 40 mg by mouth daily.      pravastatin (PRAVACHOL) 20 MG tablet Take 10 mg by mouth daily.     predniSONE  (DELTASONE ) 10 MG tablet Take two tablets (20mg ) twice daily for three days, then one tablet (10mg ) twice daily for three days, then STOP. 18 tablet 0   sodium bicarbonate 650 MG tablet Take 650 mg by mouth 2 (two) times daily.     Tiotropium Bromide Monohydrate  (SPIRIVA  RESPIMAT) 1.25 MCG/ACT AERS Inhale 2 puffs into the lungs daily. 3 each 1   topiramate (TOPAMAX) 100 MG tablet Take 100 mg by mouth 3 (three) times daily.     triamcinolone (NASACORT) 55 MCG/ACT AERO nasal inhaler Place 2 sprays into the nose daily.     No current facility-administered medications on file prior to visit.

## 2024-05-15 NOTE — Progress Notes (Signed)
 "                                                      05/15/2024, 10:21 AM  Endocrinology follow-up note  Subjective:    Patient ID: Margaret Gay, female    DOB: 04-Nov-1965, PCP Vinie Allean BIRCH, MD   Past Medical History:  Diagnosis Date   Angio-edema    Asthma    Fibromyalgia    GERD (gastroesophageal reflux disease)    Headache(784.0)    Recurrent upper respiratory infection (URI)    Seasonal allergies    Seizures (HCC)    Sleep apnea    uses cpap   TIA (transient ischemic attack)    Past Surgical History:  Procedure Laterality Date   ABDOMINAL HYSTERECTOMY     ANKLE ARTHROSCOPY     rt x2   ANKLE ARTHROSCOPY  09/07/2011   Procedure: ANKLE ARTHROSCOPY;  Surgeon: Deward DELENA Schwartz, MD;  Location: [facility];  Service: Orthopedics;  Laterality: Right;  subtalar arthrotomy with tenosynovectomy with extensive debridement   APPENDECTOMY     OOPHORECTOMY     lt   Social History   Socioeconomic History   Marital status: Single    Spouse name: Not on file   Number of children: Not on file   Years of education: Not on file   Highest education level: Not on file  Occupational History   Not on file  Tobacco Use   Smoking status: Never    Passive exposure: Never   Smokeless tobacco: Never  Vaping Use   Vaping status: Never Used  Substance and Sexual Activity   Alcohol use: No   Drug use: No   Sexual activity: Not on file  Other Topics Concern   Not on file  Social History Narrative   Not on file   Social Drivers of Health   Tobacco Use: Low Risk (05/15/2024)   Patient History    Smoking Tobacco Use: Never    Smokeless Tobacco Use: Never    Passive Exposure: Never  Financial Resource Strain: Low Risk (12/04/2023)   Received from [facility]   Overall Financial Resource Strain (CARDIA)    How hard is it for you to pay for the very basics like food, housing, medical care, and heating?: Not hard at all  Food Insecurity: No Food Insecurity (12/04/2023)    Received from [facility]   Epic    Within the past 12 months, you worried that your food would run out before you got the money to buy more.: Never true    Within the past 12 months, the food you bought just didn't last and you didn't have money to get more.: Never true  Transportation Needs: No Transportation Needs (12/04/2023)   Received from [facility] - Transportation    Lack of Transportation (Medical): No    Lack of Transportation (Non-Medical): No  Physical Activity: Inactive (12/04/2023)   Received from [facility]   Exercise Vital Sign    On average, how many days per week do you engage in moderate to strenuous exercise (like a brisk walk)?: 0 days    On average, how many minutes do you engage in exercise at this level?: 0 min  Stress: No Stress Concern Present (12/04/2023)   Received  from [facility] - Occupational Stress Questionnaire    Do you feel stress - tense, restless, nervous, or anxious, or unable to sleep at night because your mind is troubled all the time - these days?: Not at all  Social Connections: Not on file  Depression (PHQ2-9): Low Risk (12/19/2022)   Depression (PHQ2-9)    PHQ-2 Score: 0  Alcohol Screen: Not on file  Housing: Not on file  Utilities: Low Risk (12/04/2023)   Received from [facility]   Utilities    Within the past 12 months, have you been unable to get utilities(heat, electricity) when it was really needed?: No  Health Literacy: Low Risk (12/04/2023)   Received from [facility] Literacy    How often do you need to have someone help you when you read instructions, pamphlets, or other written material from your doctor or pharmacy?: Never   Family History  Problem Relation Age of Onset   Asthma Mother    Cancer Mother    Allergic rhinitis Father    Hypertension Father    Heart attack Father    Outpatient Encounter Medications as of 05/15/2024  Medication  Sig   acetaminophen  (TYLENOL ) 650 MG CR tablet Take 650 mg by mouth every 8 (eight) hours as needed for pain.   albuterol  (PROVENTIL  HFA;VENTOLIN  HFA) 108 (90 BASE) MCG/ACT inhaler Inhale 2 puffs into the lungs every 6 (six) hours as needed.   albuterol  (PROVENTIL ) (2.5 MG/3ML) 0.083% nebulizer solution Take 3 mLs (2.5 mg total) by nebulization every 6 (six) hours as needed for wheezing or shortness of breath.   amLODipine (NORVASC) 5 MG tablet Take 5 mg by mouth daily.   aspirin EC 81 MG tablet Take 81 mg by mouth daily. Swallow whole.   BREYNA  160-4.5 MCG/ACT inhaler INHALE 2 PUFFS BY MOUTH ONCE DAILY   Calcium Carbonate (CALCIUM 500 PO) Take 2 tablets by mouth daily.   calcium carbonate (TUMS - DOSED IN MG ELEMENTAL CALCIUM) 500 MG chewable tablet Chew 1 tablet by mouth as needed for indigestion or heartburn.   carboxymethylcellulose (REFRESH PLUS) 0.5 % SOLN Place 1 drop into both eyes 3 (three) times daily as needed.   cholecalciferol (VITAMIN D) 25 MCG (1000 UNIT) tablet Take 1,000 Units by mouth daily.   cycloSPORINE (RESTASIS) 0.05 % ophthalmic emulsion INSTILL 1 DROP IN EACH EYE EVERY 12 HOURS   EPINEPHrine  (EPIPEN  2-PAK) 0.3 mg/0.3 mL IJ SOAJ injection Inject 0.3 mg into the muscle as needed for anaphylaxis.   famotidine (PEPCID) 20 MG tablet Take 20 mg by mouth daily.   fexofenadine  (ALLEGRA ) 180 MG tablet Take 1 tablet (180 mg total) by mouth daily.   furosemide (LASIX) 40 MG tablet Take 40 mg by mouth daily.   lidocaine  (LIDODERM ) 5 % 2 patches daily.   Menthol, Topical Analgesic, (BIOFREEZE ROLL-ON) 4 % GEL Apply topically.   montelukast  (SINGULAIR ) 10 MG tablet Take 1 tablet (10 mg total) by mouth at bedtime.   Multiple Vitamins-Minerals (HAIR SKIN & NAILS) TABS Take 2 tablets by mouth daily in the afternoon.   pantoprazole (PROTONIX) 40 MG tablet Take 40 mg by mouth daily.   pravastatin (PRAVACHOL) 20 MG tablet Take 10 mg by mouth daily.   predniSONE  (DELTASONE ) 10 MG tablet  Take two tablets (20mg ) twice daily for three days, then one tablet (10mg ) twice daily for three days, then STOP.   sodium bicarbonate 650 MG tablet Take 650  mg by mouth 2 (two) times daily.   Tiotropium Bromide Monohydrate  (SPIRIVA  RESPIMAT) 1.25 MCG/ACT AERS Inhale 2 puffs into the lungs daily.   topiramate (TOPAMAX) 100 MG tablet Take 100 mg by mouth 3 (three) times daily.   triamcinolone (NASACORT) 55 MCG/ACT AERO nasal inhaler Place 2 sprays into the nose daily.   [DISCONTINUED] levothyroxine  (SYNTHROID ) 50 MCG tablet Take 1 tablet (50 mcg total) by mouth daily before breakfast.   levothyroxine  (SYNTHROID ) 50 MCG tablet Take 1 tablet (50 mcg total) by mouth daily before breakfast.   No facility-administered encounter medications on file as of 05/15/2024.   ALLERGIES: Allergies  Allergen Reactions   Amlodipine Swelling   Azelastine     Other Reaction(s): Bleeding from nose   Compazine [Prochlorperazine Edisylate]     Increase bp   Erythromycin Itching   Fluticasone      Other Reaction(s): Bleeding from nose   Penicillins Swelling   Voltaren [Diclofenac Sodium]     Gi upset    VACCINATION STATUS: There is no immunization history for the selected administration types on file for this patient.   HPI Margaret Gay is 59 y.o. female who was recently seen in consultation for multinodular goiter and hypothyroidism.  She is status post fine-needle aspiration of bilateral thyroid  nodules with benign findings in 2021.  She did have surveillance thyroid  ultrasound in March 2025 showing stable findings.    -She is currently on levothyroxine  50 mcg p.o. daily before breakfast.  She returns with thyroid  function test consistent with appropriate replacement.  She has no new complaints.  She presents with 6 more pounds of intentional weight loss.  -She is known to have nodular goiter at least since 2013.   She denies dysphagia, shortness of breath, nor voice change.     She denies palpitations,  tremors, heat/cold intolerance.  She denies family history of thyroid  malignancy. She has hyperlipidemia, currently on pravastatin 20 mg p.o. nightly and also on omega-3 fatty acids.     Review of Systems    Objective:       05/15/2024   10:02 AM 05/15/2024    9:44 AM 03/27/2024    9:36 AM  Vitals with BMI  Height 5' 3.5 5' 0.5 5' 0  Weight 176 lbs 176 lbs 10 oz 173 lbs 3 oz  BMI 30.68 33.91 33.83  Systolic  112 110  Diastolic  64 70  Pulse  82 90    BP 112/64 (BP Location: Left Leg, Patient Position: Sitting, Cuff Size: Large)   Pulse 82   Ht 5' 0.5 (1.537 m)   Wt 176 lb 9.6 oz (80.1 kg)   BMI 33.92 kg/m   Wt Readings from Last 3 Encounters:  05/15/24 176 lb (79.8 kg)  05/15/24 176 lb 9.6 oz (80.1 kg)  03/27/24 173 lb 3.2 oz (78.6 kg)    Physical Exam  Constitutional:  Body mass index is 33.92 kg/m.,  not in acute distress, normal state of mind, + patient wears posture stabilizer due to her propensity for falls and disequilibrium.  She ambulates with a walker. Eyes: PERRLA, EOMI, no exophthalmos ENT: moist mucous membranes,  + gross nodular  thyromegaly, no gross cervical lymphadenopathy   CMP ( most recent) CMP     Component Value Date/Time   NA 143 01/21/2010 1648   K 3.3 (L) 01/21/2010 1648   CL 117 (H) 01/21/2010 1648   CO2 19 01/21/2010 1648   GLUCOSE 141 (H) 01/21/2010 1648   BUN 21 01/21/2010  1648   CREATININE 1.17 01/21/2010 1648   CALCIUM 8.6 01/21/2010 1648   PROT 7.4 12/22/2009 1900   ALBUMIN 4.1 12/22/2009 1900   AST 15 12/22/2009 1900   ALT 10 12/22/2009 1900   ALKPHOS 66 12/22/2009 1900   BILITOT 0.4 12/22/2009 1900   GFRNONAA 50 (L) 01/21/2010 1648   GFRAA  01/21/2010 1648    >60        The eGFR has been calculated using the MDRD equation. This calculation has not been validated in all clinical situations. eGFR's persistently <60 mL/min signify possible Chronic Kidney Disease.     Diabetic Labs (most recent): Lab Results   Component Value Date   HGBA1C 5.8 07/05/2019     Lipid Panel ( most recent) Lipid Panel     Component Value Date/Time   CHOL 195 11/20/2023 1226   TRIG 116 11/20/2023 1226   HDL 80 11/20/2023 1226   CHOLHDL 2.4 11/20/2023 1226   LDLCALC 95 11/20/2023 1226   LABVLDL 20 11/20/2023 1226    Recent Results (from the past 2160 hours)  TSH     Status: Abnormal   Collection Time: 05/06/24 12:02 PM  Result Value Ref Range   TSH 0.436 (L) 0.450 - 4.500 uIU/mL  T4, free     Status: None   Collection Time: 05/06/24 12:02 PM  Result Value Ref Range   Free T4 1.07 0.82 - 1.77 ng/dL   January 09, 2020 biopsy results are benign on bilateral thyroid  nodules.  Thyroid  ultrasound on July 12, 2023 IMPRESSION: 1. No significant interval change in the size or appearance of previously biopsied nodules in the right upper and right mid gland compared to prior imaging. Assuming previously benign biopsy diagnosis, no further imaging follow-up is recommended. 2. Additional small isthmic and left mid thyroid  nodules are noted but do not meet criteria for biopsy or dedicated imaging surveillance. No follow-up imaging recommended.  Assessment & Plan:   1.  Multinodular goiter  2.  Hypothyroidism  3.  Hyperlipidemia Her thyroid  function tests are consistent with appropriate replacement.  She is advised to continue levothyroxine  50 mcg p.o. daily before breakfast.      - We discussed about the correct intake of her thyroid  hormone, on empty stomach at fasting, with water, separated by at least 30 minutes from breakfast and other medications,  and separated by more than 4 hours from calcium, iron, multivitamins, acid reflux medications (PPIs). -Patient is made aware of the fact that thyroid  hormone replacement is needed for life, dose to be adjusted by periodic monitoring of thyroid  function tests.    Regarding her multinodular goiter:  She is status post fine-needle aspiration of  largest/concerning nodules on bilateral thyroid  lobes with benign outcomes in 2021.  Unremarkable thyroid  ultrasound findings in March 2025.  She will not need any definitive antithyroid intervention at this time.   In light of her hyperlipidemia, currently on pravastatin 10 mg p.o. nightly.  -She has also engaged in lifestyle medicine nutrition.  Her most recent lipid panel shows LDL at 95.     - she is advised to maintain close follow up with Hicks, Kristin D, MD for primary care needs.   I spent  20  minutes in the care of the patient today including review of labs from Thyroid  Function, CMP, and other relevant labs ; imaging/biopsy records (current and previous including abstractions from other facilities); face-to-face time discussing  her lab results and symptoms, medications doses, her options of short and  long term treatment based on the latest standards of care / guidelines;   and documenting the encounter.  Margaret Gay  participated in the discussions, expressed understanding, and voiced agreement with the above plans.  All questions were answered to her satisfaction. she is encouraged to contact clinic should she have any questions or concerns prior to her return visit. Dear Patient: Feel free to review your progress notes.  If you are reviewing this progress note and have questions about the meaning of /or medical terms being used, please make a note and address it at your next follow-up appointment.  Medical notes are meant to be a communication tool between medical professionals and require medical terms to be used for efficiency and insurance approval.    Follow up plan: Return in about 6 months (around 11/12/2024) for Fasting Labs  in AM B4 8.   Ranny Earl, MD [facility] [address] [place], [place] [address] Phone: [phone]  Fax: [phone]     05/15/2024, 10:21 AM  This note was partially dictated with voice  recognition software. Similar sounding words can be transcribed inadequately or may not  be corrected upon review.  "

## 2024-05-16 ENCOUNTER — Ambulatory Visit: Admitting: Nutrition

## 2024-05-16 ENCOUNTER — Ambulatory Visit: Admitting: "Endocrinology

## 2024-05-28 ENCOUNTER — Encounter: Payer: Self-pay | Admitting: Nutrition

## 2024-05-28 NOTE — Patient Instructions (Signed)
 Talk to the [facility] about silver sneaker program. Eat three meals per day Get in 150-250 minutes of exercise weekly. Gett A1C down to 5.7% and LDL less than 100 mg/dl. Aim for 15 grams of fiber per day

## 2024-05-31 ENCOUNTER — Telehealth: Payer: Self-pay | Admitting: Allergy & Immunology

## 2024-05-31 ENCOUNTER — Ambulatory Visit: Admitting: Family Medicine

## 2024-05-31 NOTE — Telephone Encounter (Signed)
 PT called to can/resch appt; advised still having issues following ER trip - I advised unsure of what clinical could do without that visit, but would still send back to clinical to let them know, she thanked

## 2024-05-31 NOTE — Telephone Encounter (Signed)
I called and scheduled a sooner appt.

## 2024-05-31 NOTE — Patient Instructions (Incomplete)
 Asthma Continue Symbicort  160-2 puffs twice a day with a spacer to prevent cough or wheeze Continue Spiriva  2 puffs once a day to prevent cough or wheeze Continue albuterol  2 puffs once every 4 hours if needed for cough or wheeze You may use albuterol  2 puffs 5 to 15 minutes before activity to decrease cough or wheeze  Allergic rhinitis Continue allergen avoidance measures directed toward dust mite, cockroach, tree pollen, weed pollen, and ragweed weed pollen Continue allergen immunotherapy and have access to an epinephrine  autoinjector set per protocol Continue Allegra  as needed, continue Nasacort daily Continue allergen immunotherapy and have access to an epinephrine  autoinjector sent  Recurrent infections Get a PPSV 23 vaccine.  Let us  know when you get this vaccine and we will check your lab work in 4 to 6 weeks  Call the clinic if this treatment plan is not working well for you.  Follow up in 6 months or sooner if needed.

## 2024-05-31 NOTE — Progress Notes (Unsigned)
" ° °  [address] [place]  [place] [address] Dept: [phone]  FOLLOW UP NOTE  Patient ID: Margaret Gay, female    DOB: 1966/02/06  Age: 59 y.o. MRN: [id number] Date of Office Visit: 05/31/2024  Assessment  Chief Complaint: No chief complaint on file.  HPI Margaret BECKETT is a 59 year old female who presents to the clinic for follow-up visit.  She was last seen in this clinic on 03/26/2024 by Dr. Iva for evaluation of asthma, allergic rhinitis, nasal polyposis, recurrent infection, and acute sinusitis requiring doxycycline .  She began allergen immunotherapy directed toward dust mite, cockroach, tree pollen, ragweed pollen, and weed pollen on 03/26/2024.  Discussed the use of AI scribe software for clinical note transcription with the patient, who gave verbal consent to proceed.  History of Present Illness      Drug Allergies:  Allergies[1]  Physical Exam: There were no vitals taken for this visit.   Physical Exam  Diagnostics:    Assessment and Plan: No diagnosis found.  No orders of the defined types were placed in this encounter.   There are no Patient Instructions on file for this visit.  No follow-ups on file.    Thank you for the opportunity to care for this patient.  Please do not hesitate to contact me with questions.  Arlean Mutter, FNP [facility]          [1]  Allergies Allergen Reactions   Amlodipine Swelling   Azelastine     Other Reaction(s): Bleeding from nose   Compazine [Prochlorperazine Edisylate]     Increase bp   Erythromycin Itching   Fluticasone      Other Reaction(s): Bleeding from nose   Penicillins Swelling   Voltaren [Diclofenac Sodium]     Gi upset   "

## 2024-06-07 ENCOUNTER — Ambulatory Visit: Admitting: Allergy & Immunology

## 2024-06-07 ENCOUNTER — Encounter: Payer: Self-pay | Admitting: Allergy & Immunology

## 2024-06-07 VITALS — BP 98/60 | HR 92 | Temp 97.6°F | Resp 12 | Wt 177.4 lb

## 2024-06-07 DIAGNOSIS — D806 Antibody deficiency with near-normal immunoglobulins or with hyperimmunoglobulinemia: Secondary | ICD-10-CM

## 2024-06-07 MED ORDER — SULFAMETHOXAZOLE-TRIMETHOPRIM 800-160 MG PO TABS: 1.0000 | ORAL_TABLET | Freq: Two times a day (BID) | ORAL | 0 refills | Status: AC

## 2024-06-07 NOTE — Patient Instructions (Addendum)
 1. Recurrent infections - multiple episodes of pneumonia - You were only protective to 7/23 serotypes of Streptococcus pneumoniae. - Repeat titers ordered (you can go and get these done today).  - We will make a decision about where to go from here based on the labs that we ordered today.  - I would guess that we are going to add on the prophylactic (preventative) antibiotic.  - This will help determine whether or not we can start immunoglobulin replacement or prophylactic (preventative) antibiotics.  - Prophylactic antibiotics are taken daily to prevent infections. - Immunoglobulin replacement consists of infusions (weekly, biweekly, or monthly) to boost your immune system and prevent you from getting infections.  - Learning more about specific antibody deficiency here: https://primaryimmune.org/understanding-primary-immunodeficiency/types-of-pi/specific-antibody-deficiency  2. Recurrent sinusitis - We are going to get the records from [facility],   - Continue with the use of the nose sprays.  - Start Bactrim  one tablet twice daily for ten days.  - I would like you to see ENT.   3. Moderate persistent asthma, uncomplicated - Lung testing looks lower today. - Daily controller medication(s): Symbicort  160/4.91mcg two puffs twice daily with spacer and Spiriva  1.25mcg two puffs once daily - Prior to physical activity: albuterol  2 puffs 10-15 minutes before physical activity. - Rescue medications: albuterol  4 puffs every 4-6 hours as needed - Asthma control goals:  * Full participation in all desired activities (may need albuterol  before activity) * Albuterol  use two time or less a week on average (not counting use with activity) * Cough interfering with sleep two time or less a month * Oral steroids no more than once a year * No hospitalizations  4. Chronic rhinitis (dust mites, cockroach, mountain cedar, ragweed, weeds) - Continue with the allergy  shots at the same schedule.  - Continue with  Allegra  (fexofenadine ) 180 mg daily. - Continue with Nasacort one spray per nostril twice daily.  4. Follow up as scheduled.    Please inform us  of any Emergency Department visits, hospitalizations, or changes in symptoms. Call us  before going to the ED for breathing or allergy  symptoms since we might be able to fit you in for a sick visit. Feel free to contact us  anytime with any questions, problems, or concerns.  It was a pleasure to see you again today!  Websites that have reliable patient information: 1. American Academy of Asthma, Allergy , and Immunology: www.aaaai.org 2. Food Allergy  Research and Education (FARE): foodallergy.org 3. Mothers of Asthmatics: http://www.asthmacommunitynetwork.org 4. American College of Allergy , Asthma, and Immunology: www.acaai.org      Like us  on [employer] and Instagram for our latest updates!      A healthy democracy works best when [employer] participate! Make sure you are registered to vote! If you have moved or changed any of your contact information, you will need to get this updated before voting! Scan the QR codes below to learn more!

## 2024-06-07 NOTE — Progress Notes (Unsigned)
 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 Recurrent infections - multiple episodes of pneumonia - You were only protective to 7/23 serotypes of Streptococcus pneumoniae. - Repeat titers ordered (you can go and get these done today).  - We will make a decision about where to go from here based on the labs that we ordered today.  - I would guess that we are going to add on the prophylactic (preventative) antibiotic.  - This will help determine whether or not we can start immunoglobulin replacement or prophylactic (preventative) antibiotics.  - Prophylactic antibiotics are taken daily to prevent infections. - Immunoglobulin replacement consists of infusions (weekly, biweekly, or monthly) to boost your immune system and prevent you from getting infections.  - Learning more about specific antibody deficiency here: https://primaryimmune.org/understanding-primary-immunodeficiency/types-of-pi/specific-antibody-deficiency  2. Recurrent sinusitis - We are going to get the records from [facility],   - Continue with the use of the nose sprays.  - Start Bactrim  one tablet twice daily for ten days.  - I would like you to see ENT.   3. Moderate persistent asthma, uncomplicated - Lung testing looks lower today. - Daily controller medication(s): Symbicort  160/4.66mcg two puffs twice daily with spacer and Spiriva  1.25mcg two puffs once daily - Prior to physical activity: albuterol  2 puffs 10-15  minutes before physical activity. - Rescue medications: albuterol  4 puffs every 4-6 hours as needed - Asthma control goals:  * Full participation in all desired activities (may need albuterol  before activity) * Albuterol  use two time or less a week on average (not counting use with activity) * Cough interfering with sleep two time or less a month * Oral steroids no more than once a year * No hospitalizations  4. Chronic rhinitis (dust mites, cockroach, mountain cedar, ragweed, weeds) - Continue with the allergy  shots at the same schedule.  - Continue with Allegra  (fexofenadine ) 180 mg daily. - Continue with Nasacort one spray per nostril twice daily.  4. Follow up as scheduled.    Please inform us  of any Emergency Department visits, hospitalizations, or changes in symptoms. Call us  before going to the ED for breathing or allergy  symptoms since we might be able to fit you in for a sick visit. Feel free to contact us  anytime with any questions, problems, or concerns.  It was a pleasure to see you again today!  Websites that have reliable patient information: 1. American Academy of Asthma, Allergy , and Immunology: www.aaaai.org 2. Food Allergy  Research and Education (FARE): foodallergy.org 3. Mothers of Asthmatics: http://www.asthmacommunitynetwork.org 4. American College of Allergy , Asthma, and Immunology: www.acaai.org      Like us  on [employer] and Instagram for our latest updates!      A healthy democracy works best when [employer] participate! Make sure you are registered to vote! If you have moved or changed any of your contact information, you will need to get this updated before voting! Scan the QR codes below to learn more!           Subjective:   Margaret Gay is a  59 y.o. female presenting today for follow up of  Chief Complaint  Patient presents with   Follow-up    Margaret Gay has a history of the following: Patient Active Problem List   Diagnosis Date  Noted   Moderate persistent asthma, uncomplicated 11/15/2023   Recurrent infections 11/15/2023   Seasonal and perennial allergic rhinitis 11/15/2023   Acute recurrent maxillary sinusitis 11/15/2023   Mixed hyperlipidemia 08/12/2021   Multinodular thyroid  12/17/2019   Hypothyroidism 12/17/2019    History obtained from: chart review and patient.  Discussed the use of AI scribe software for clinical note transcription with the patient and/or guardian, who gave verbal consent to proceed.  Margaret Gay is a 59 y.o. female presenting for a follow up visit.  She was last seen November 2025.  At that time, she only hypertension is send 23 serotypes of Streptococcus pneumonia.  Recommended that she get the Pneumovax or the Prevnar 20 with repeat antibodies for 6 weeks after best.  We did treat her with doxycycline  100 mg twice a day for her sinusitis.  Her lung testing looked lower.  We gave her a nebulizer machine and discussed starting her on an injectable medication to help with her asthma.  We continue with Symbicort  160 mcg 2 puffs twice daily as well as Spiriva  1.25 mcg 2 puffs once daily.  For her rhinitis, we will continue with allergy  shots as well as Allegra  and Nasacort.  Since last visit, she has mostly done well.   Asthma/Respiratory Symptom History: ***  Allergic Rhinitis Symptom History: She has been having some problems with her sinuses.   Food Allergy  Symptom History: ***  Skin Symptom History: ***  GERD Symptom History: ***  Infection Symptom History: She gto the [facility]NA in mid December.   Otherwise, there have been no changes to her past medical history, surgical history, family history, or social history.    Review of systems otherwise negative other than that mentioned in the HPI.    Objective:   Blood pressure 98/60, pulse 92, temperature 97.6 F (36.4 C), resp. rate 12, weight 177 lb 6 oz (80.5 kg). Body mass index is 30.93 kg/m.    Physical Exam   Diagnostic  studies: {Blank single:19197::none,deferred due to recent antihistamine use,deferred due to insurance stipulations that require a separate visit for testing,labs sent instead, }  Spirometry: {Blank single:19197::results normal (FEV1: ***%, FVC: ***%, FEV1/FVC: ***%),results abnormal (FEV1: ***%, FVC: ***%, FEV1/FVC: ***%)}.    {Blank single:19197::Spirometry consistent with mild obstructive disease,Spirometry consistent with moderate obstructive disease,Spirometry consistent with severe obstructive disease,Spirometry consistent with possible restrictive disease,Spirometry consistent with mixed obstructive and restrictive disease,Spirometry uninterpretable due to technique,Spirometry consistent with normal pattern}. {Blank single:19197::Albuterol /Atrovent nebulizer,Xopenex/Atrovent nebulizer,Albuterol  nebulizer,Albuterol  four puffs via MDI,Xopenex four puffs via MDI} treatment given in clinic with {Blank single:19197::significant improvement in FEV1 per ATS criteria,significant improvement in FVC per ATS criteria,significant improvement in FEV1 and FVC per ATS criteria,improvement in FEV1, but not significant per ATS criteria,improvement in FVC, but not significant per ATS criteria,improvement in FEV1 and FVC, but not significant per ATS criteria,no improvement}.  Allergy  Studies: {Blank single:19197::none,deferred due to recent antihistamine use,deferred due to insurance stipulations that require a separate visit for testing,labs sent instead, }    {Blank single:19197::Allergy  testing results were read and interpreted by myself, documented by clinical staff., }      Marty Shaggy, MD  [facility]        "

## 2024-07-03 ENCOUNTER — Ambulatory Visit: Admitting: Family Medicine

## 2024-11-12 ENCOUNTER — Ambulatory Visit: Admitting: "Endocrinology
